# Patient Record
Sex: Female | Born: 1995 | Race: Black or African American | Hispanic: No | Marital: Single | Smoking: Never smoker
Health system: Southern US, Community
[De-identification: ages and names within clinical notes are randomized; demographics above are authoritative.]

---
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
## 2017-08-19 ENCOUNTER — Ambulatory Visit (INDEPENDENT_AMBULATORY_CARE_PROVIDER_SITE_OTHER): Payer: Managed Care, Other (non HMO) | Admitting: *Deleted

## 2017-08-19 DIAGNOSIS — Z111 Encounter for screening for respiratory tuberculosis: Secondary | ICD-10-CM

## 2017-08-21 LAB — TB SKIN TEST
Induration: 0 mm
TB Skin Test: NEGATIVE

## 2017-09-08 ENCOUNTER — Encounter: Payer: Managed Care, Other (non HMO) | Admitting: Family Medicine

## 2017-09-15 ENCOUNTER — Encounter: Payer: Managed Care, Other (non HMO) | Admitting: Family Medicine

## 2017-10-05 ENCOUNTER — Ambulatory Visit (INDEPENDENT_AMBULATORY_CARE_PROVIDER_SITE_OTHER): Payer: Managed Care, Other (non HMO) | Admitting: Family Medicine

## 2017-10-05 ENCOUNTER — Encounter: Payer: Self-pay | Admitting: Family Medicine

## 2017-10-05 VITALS — BP 118/74 | Ht 65.5 in | Wt 198.0 lb

## 2017-10-05 DIAGNOSIS — Z0000 Encounter for general adult medical examination without abnormal findings: Secondary | ICD-10-CM | POA: Diagnosis not present

## 2017-10-05 NOTE — Progress Notes (Signed)
   Subjective:    Patient ID: Lisa Moses, female    DOB: [birth year], 21 y.o.   MRN: [id number]  HPI The patient comes in today for a wellness visit.    A review of their health history was completed.  A review of medications was also completed.  Any needed refills; not taking any meds  Eating habits: not health conscious  Falls/  MVA accidents in past few months: none  Regular exercise: cardio and weight training one hour 5 days a week  Specialist pt sees on regular basis: none  Preventative health issues were discussed.   Additional concerns: none  Declines flu vaccine and HPV. Does not want any vaccines today. Would like to discuss with parents first.     Review of Systems  Constitutional: Negative for activity change, appetite change and fatigue.  HENT: Negative for congestion and rhinorrhea.   Eyes: Negative for discharge.  Respiratory: Negative for cough, chest tightness and wheezing.   Cardiovascular: Negative for chest pain.  Gastrointestinal: Negative for abdominal pain, blood in stool and vomiting.  Endocrine: Negative for polyphagia.  Genitourinary: Negative for difficulty urinating and frequency.  Musculoskeletal: Negative for neck pain.  Skin: Negative for color change.  Allergic/Immunologic: Negative for environmental allergies and food allergies.  Neurological: Negative for weakness and headaches.  Psychiatric/Behavioral: Negative for agitation and behavioral problems.       Objective:   Physical Exam  Constitutional: She is oriented to person, place, and time. She appears well-developed and well-nourished.  HENT:  Head: Normocephalic and atraumatic.  Right Ear: External ear normal.  Left Ear: External ear normal.  Eyes: Right eye exhibits no discharge. Left eye exhibits no discharge.  Neck: Normal range of motion. No tracheal deviation present.  Cardiovascular: Normal rate, regular rhythm, normal heart sounds and intact distal pulses. Exam  reveals no gallop.  No murmur heard. Pulmonary/Chest: Effort normal and breath sounds normal. No stridor. No respiratory distress. She has no wheezes. She has no rales.  Abdominal: Soft. Bowel sounds are normal. She exhibits no distension and no mass. There is no tenderness. There is no rebound and no guarding.  Musculoskeletal: Normal range of motion. She exhibits no edema or tenderness.  Lymphadenopathy:    She has no cervical adenopathy.  Neurological: She is alert and oriented to person, place, and time. She exhibits normal muscle tone.  Skin: Skin is warm and dry.  Psychiatric: She has a normal mood and affect. Her behavior is normal.          Assessment & Plan:  Adult wellness-complete.wellness physical was conducted today. Importance of diet and exercise were discussed in detail. In addition to this a discussion regarding safety was also covered. We also reviewed over immunizations and gave recommendations regarding current immunization needed for age. In addition to this additional areas were also touched on including: Preventative health exams needed: Colonoscopy not indicated  Patient was advised yearly wellness exam  Patient is not sexually active Does not smoke or drink Patient counseled on safety safe driving safe choices Counseled to get HPV and second chickenpox shot through health department or may get HPV vaccine through [place]  Approved for substitute teaching

## 2017-12-21 ENCOUNTER — Ambulatory Visit: Payer: Managed Care, Other (non HMO) | Admitting: Family Medicine

## 2019-02-11 ENCOUNTER — Encounter: Payer: Self-pay | Admitting: Family Medicine

## 2019-02-11 ENCOUNTER — Ambulatory Visit (INDEPENDENT_AMBULATORY_CARE_PROVIDER_SITE_OTHER): Payer: Managed Care, Other (non HMO) | Admitting: Family Medicine

## 2019-02-11 ENCOUNTER — Other Ambulatory Visit: Payer: Self-pay

## 2019-02-11 VITALS — BP 126/84 | Temp 98.2°F | Ht 65.5 in | Wt 172.0 lb

## 2019-02-11 DIAGNOSIS — B349 Viral infection, unspecified: Secondary | ICD-10-CM | POA: Diagnosis not present

## 2019-02-11 NOTE — Progress Notes (Signed)
   Subjective:    Patient ID: Lisa Moses, female    DOB: Apr 26, 1996, 23 y.o.   MRN: [id number]  Sinusitis  This is a new problem. Episode onset: 5 days. Associated symptoms include congestion, headaches and a sore throat. Treatments tried: gargle salt water.  first day or so throat got to hurting and feeloing scratchy  Has ben having heaches   No cough Not severe h a s  enry kevel overall fine  No fever    Results for orders placed or performed in visit on 08/19/17  TB Skin Test  Result Value Ref Range   TB Skin Test Negative    Induration 0 mm   Initial  Review of Systems  HENT: Positive for congestion and sore throat.   Neurological: Positive for headaches.       Objective:   Physical Exam Alert active good hydration HEENT mild stuffiness pharynx normal neck supple lungs clear heart regular       Assessment & Plan:  Impression viral syndrome plan of care discussed warning signs discussed

## 2019-02-14 ENCOUNTER — Encounter: Payer: Self-pay | Admitting: Family Medicine

## 2019-02-21 ENCOUNTER — Telehealth: Payer: Self-pay | Admitting: Family Medicine

## 2019-02-21 ENCOUNTER — Encounter: Payer: Self-pay | Admitting: Family Medicine

## 2019-02-21 NOTE — Telephone Encounter (Signed)
Note ready for pick up. Pt aware.

## 2019-02-21 NOTE — Telephone Encounter (Signed)
Please go ahead and give 

## 2019-02-21 NOTE — Telephone Encounter (Signed)
Please advise. Thank you

## 2019-02-21 NOTE — Telephone Encounter (Signed)
Patient experiencing same symptoms as patients mother runny nose, scratchy throat, headaches, no cough or fever, or vomiting. Patient requesting work note from Tuesday last week 02/15/19 through this week. Advise.

## 2019-02-21 NOTE — Telephone Encounter (Signed)
Please give work note

## 2020-02-02 ENCOUNTER — Other Ambulatory Visit: Payer: Self-pay

## 2020-02-02 ENCOUNTER — Ambulatory Visit: Payer: BC Managed Care – PPO | Attending: Internal Medicine

## 2020-02-02 DIAGNOSIS — Z20822 Contact with and (suspected) exposure to covid-19: Secondary | ICD-10-CM | POA: Diagnosis not present

## 2020-02-03 LAB — NOVEL CORONAVIRUS, NAA: SARS-CoV-2, NAA: NOT DETECTED

## 2020-05-21 ENCOUNTER — Ambulatory Visit (INDEPENDENT_AMBULATORY_CARE_PROVIDER_SITE_OTHER): Payer: BC Managed Care – PPO | Admitting: Family Medicine

## 2020-05-21 ENCOUNTER — Other Ambulatory Visit: Payer: Self-pay

## 2020-05-21 ENCOUNTER — Encounter: Payer: Self-pay | Admitting: Family Medicine

## 2020-05-21 VITALS — BP 130/84 | Temp 98.1°F | Wt 168.8 lb

## 2020-05-21 DIAGNOSIS — R110 Nausea: Secondary | ICD-10-CM | POA: Diagnosis not present

## 2020-05-21 DIAGNOSIS — D509 Iron deficiency anemia, unspecified: Secondary | ICD-10-CM | POA: Diagnosis not present

## 2020-05-21 DIAGNOSIS — R42 Dizziness and giddiness: Secondary | ICD-10-CM

## 2020-05-21 LAB — POCT HEMOGLOBIN: Hemoglobin: 11.8 g/dL (ref 11–14.6)

## 2020-05-21 MED ORDER — ONDANSETRON HCL 8 MG PO TABS: ORAL_TABLET | ORAL | 2 refills | Status: DC

## 2020-05-21 NOTE — Progress Notes (Signed)
   Subjective:    Patient ID: Lisa Moses, female    DOB: December 20, 1995, 24 y.o.   MRN: [id number]  HPI Pt here today for dizziness, nauseous, light headed, vomited x2, headache, abdominal pain when having symptoms, feeling of room spinning. Started about one week ago. Has had these issues back in October and December. Pt states that she gets this symptoms before starting her cycle. Next cycle due 05/28/20.   Pt states she has heavy cycles also. Pt has been taking extra strength Tylenol.  June 11 woke up early vomited 2 times Nausea and off and on dizzy feel Most evenings has the sx No resp illness Stress levels ok as best she knows  Feels like her surroundings are moving a little Room not spinning Light heased sensation a little bit of come and go Some nausea comes and goes No abd pain Has nausea when the lightheaded sensatin occurs Tried tylenol  Cycles off and on heavy Dietary trying to eat ok Drinks a fair amount of water Sleeping good  Moods ok Stress ok Walking some and gets out daily  Laying : 122/84 90 Sitting: 118/82 85 Standing: 124/78 102  Review of Systems  Constitutional: Negative for activity change and appetite change.  HENT: Negative for congestion and rhinorrhea.   Respiratory: Negative for cough and shortness of breath.   Cardiovascular: Negative for chest pain and leg swelling.  Gastrointestinal: Positive for nausea. Negative for abdominal pain and vomiting.  Skin: Negative for color change.  Neurological: Negative for dizziness and weakness.  Psychiatric/Behavioral: Negative for agitation and confusion.       Objective:   Physical Exam Vitals reviewed.  Constitutional:      General: She is not in acute distress. HENT:     Head: Normocephalic and atraumatic.  Eyes:     General:        Right eye: No discharge.        Left eye: No discharge.  Neck:     Trachea: No tracheal deviation.  Cardiovascular:     Rate and Rhythm: Normal rate and  regular rhythm.     Heart sounds: Normal heart sounds. No murmur heard.   Pulmonary:     Effort: Pulmonary effort is normal. No respiratory distress.     Breath sounds: Normal breath sounds.  Lymphadenopathy:     Cervical: No cervical adenopathy.  Skin:    General: Skin is warm and dry.  Neurological:     Mental Status: She is alert.     Coordination: Coordination normal.  Psychiatric:        Behavior: Behavior normal.    Finger-to-nose normal.  Romberg negative.  Neurologic grossly normal.       Assessment & Plan:  Altered alertness Altered sensorium Dizziness Not true vertigo Encourage patient to do a better job with dietary measures throughout the day and better fluid intake Lab work ordered Patient to give [place] update within 2 weeks If persistent symptoms or worse may need referral to neurology I do not feel she needs MRI currently Lab work ordered

## 2020-05-22 ENCOUNTER — Other Ambulatory Visit: Payer: Self-pay | Admitting: *Deleted

## 2020-05-22 DIAGNOSIS — D509 Iron deficiency anemia, unspecified: Secondary | ICD-10-CM

## 2020-05-22 LAB — CBC WITH DIFFERENTIAL/PLATELET
Basophils Absolute: 0.1 10*3/uL (ref 0.0–0.2)
Basos: 1 %
EOS (ABSOLUTE): 0.1 10*3/uL (ref 0.0–0.4)
Eos: 2 %
Hematocrit: 37 % (ref 34.0–46.6)
Hemoglobin: 11.9 g/dL (ref 11.1–15.9)
Immature Grans (Abs): 0 10*3/uL (ref 0.0–0.1)
Immature Granulocytes: 0 %
Lymphocytes Absolute: 2.4 10*3/uL (ref 0.7–3.1)
Lymphs: 41 %
MCH: 22.4 pg — ABNORMAL LOW (ref 26.6–33.0)
MCHC: 32.2 g/dL (ref 31.5–35.7)
MCV: 70 fL — ABNORMAL LOW (ref 79–97)
Monocytes Absolute: 0.5 10*3/uL (ref 0.1–0.9)
Monocytes: 8 %
Neutrophils Absolute: 2.9 10*3/uL (ref 1.4–7.0)
Neutrophils: 48 %
Platelets: 315 10*3/uL (ref 150–450)
RBC: 5.31 x10E6/uL — ABNORMAL HIGH (ref 3.77–5.28)
RDW: 15.5 % — ABNORMAL HIGH (ref 11.7–15.4)
WBC: 5.9 10*3/uL (ref 3.4–10.8)

## 2020-05-22 LAB — COMPREHENSIVE METABOLIC PANEL
ALT: 10 IU/L (ref 0–32)
AST: 12 IU/L (ref 0–40)
Albumin/Globulin Ratio: 1.8 (ref 1.2–2.2)
Albumin: 4.5 g/dL (ref 3.9–5.0)
Alkaline Phosphatase: 41 IU/L — ABNORMAL LOW (ref 48–121)
BUN/Creatinine Ratio: 13 (ref 9–23)
BUN: 10 mg/dL (ref 6–20)
Bilirubin Total: 0.3 mg/dL (ref 0.0–1.2)
CO2: 22 mmol/L (ref 20–29)
Calcium: 9.2 mg/dL (ref 8.7–10.2)
Chloride: 105 mmol/L (ref 96–106)
Creatinine, Ser: 0.79 mg/dL (ref 0.57–1.00)
GFR calc Af Amer: 123 mL/min/{1.73_m2} (ref >59)
GFR calc non Af Amer: 107 mL/min/{1.73_m2} (ref >59)
Globulin, Total: 2.5 g/dL (ref 1.5–4.5)
Glucose: 93 mg/dL (ref 65–99)
Potassium: 4.1 mmol/L (ref 3.5–5.2)
Sodium: 139 mmol/L (ref 134–144)
Total Protein: 7 g/dL (ref 6.0–8.5)

## 2020-05-22 LAB — SPECIMEN STATUS REPORT

## 2020-05-23 LAB — IRON AND TIBC
Iron Saturation: 31 % (ref 15–55)
Iron: 128 ug/dL (ref 27–159)
Total Iron Binding Capacity: 409 ug/dL (ref 250–450)
UIBC: 281 ug/dL (ref 131–425)

## 2020-05-23 LAB — SPECIMEN STATUS REPORT

## 2020-05-31 ENCOUNTER — Encounter: Payer: Self-pay | Admitting: Family Medicine

## 2020-05-31 DIAGNOSIS — R42 Dizziness and giddiness: Secondary | ICD-10-CM

## 2020-05-31 NOTE — Telephone Encounter (Signed)
Nurses I sympathize with what is going on with the patient  She can keep her appointment but I also recommend that we initiate consultation with neurology in [place] because of ongoing symptoms-it could take several weeks to get in with specialist

## 2020-05-31 NOTE — Telephone Encounter (Signed)
Called pt and she states now she is just having nausea. No dizziness since earlier today. States she was just keeping a record of her symptoms because dr scott wanted to get an update and she has an appt coming up next week. Aware dr Nicki Reaper is out of office today.

## 2020-06-01 NOTE — Addendum Note (Signed)
Addended by: Vicente Males on: 06/01/2020 08:12 AM   Modules accepted: Orders

## 2020-06-05 ENCOUNTER — Encounter: Payer: Self-pay | Admitting: Family Medicine

## 2020-06-05 ENCOUNTER — Other Ambulatory Visit: Payer: Self-pay

## 2020-06-05 ENCOUNTER — Ambulatory Visit: Payer: BC Managed Care – PPO | Admitting: Family Medicine

## 2020-06-05 VITALS — BP 112/88 | Temp 97.8°F | Wt 169.6 lb

## 2020-06-05 DIAGNOSIS — R42 Dizziness and giddiness: Secondary | ICD-10-CM

## 2020-06-05 DIAGNOSIS — N926 Irregular menstruation, unspecified: Secondary | ICD-10-CM | POA: Diagnosis not present

## 2020-06-05 MED ORDER — NORETHIN ACE-ETH ESTRAD-FE 1-20 MG-MCG(24) PO TABS: 1.0000 | ORAL_TABLET | Freq: Every day | ORAL | 11 refills | Status: DC

## 2020-06-05 NOTE — Progress Notes (Signed)
   Subjective:    Patient ID: Lisa Moses, female    DOB: [birth year], 24 y.o.   MRN: [id number]  HPI Patient comes in today for follow up on dizziness. Patient states she feels it has been somewhat better. She has had 2 days of feeling nauseous and felt like her surroundings her moving since her last appointment. She did have her menstrual cycle and did not have symptoms which is typical.  Patient would like to discuss birth control.  Where she feels little off balance and will last for anywhere from a few minutes to several hours  Patient relates finds her self feeling unsteady when it occurs but the room does not spin Review of Systems  Constitutional: Negative for activity change, fatigue and fever.  HENT: Negative for congestion and rhinorrhea.   Respiratory: Negative for cough, chest tightness and shortness of breath.   Cardiovascular: Negative for chest pain and leg swelling.  Gastrointestinal: Negative for abdominal pain and nausea.  Skin: Negative for color change.  Neurological: Negative for dizziness and headaches.  Psychiatric/Behavioral: Negative for agitation and behavioral problems.       Objective:   Physical Exam Vitals reviewed.  Constitutional:      General: She is not in acute distress. HENT:     Head: Normocephalic and atraumatic.  Eyes:     General:        Right eye: No discharge.        Left eye: No discharge.  Neck:     Trachea: No tracheal deviation.  Cardiovascular:     Rate and Rhythm: Normal rate and regular rhythm.     Heart sounds: Normal heart sounds. No murmur heard.   Pulmonary:     Effort: Pulmonary effort is normal. No respiratory distress.     Breath sounds: Normal breath sounds.  Lymphadenopathy:     Cervical: No cervical adenopathy.  Skin:    General: Skin is warm and dry.  Neurological:     Mental Status: She is alert.     Coordination: Coordination normal.  Psychiatric:        Behavior: Behavior normal.             Assessment & Plan:  Dizzy spells-patient will be seeing neurology.  I do not feel this is inner ear.  Reassurance given. Patient requesting birth control pills to regulate her cycles she thinks it might actually help her other symptoms as well birth control pills were sent in

## 2020-06-06 ENCOUNTER — Encounter: Payer: Self-pay | Admitting: Family Medicine

## 2020-08-30 ENCOUNTER — Telehealth: Payer: Self-pay | Admitting: *Deleted

## 2020-08-30 NOTE — Telephone Encounter (Signed)
Patient calls with cough, congestion and sore throat for one week- stated she took a Covid test that was negative but now her chest hurts when she coughs. No difficulty breathing except because of stuffy nose. Patient scheduled appt tomorrow for evaluation but advised to go to ER tonight if symptoms worsen- Warning signs discussed.

## 2020-08-31 ENCOUNTER — Other Ambulatory Visit: Payer: Self-pay

## 2020-08-31 ENCOUNTER — Encounter: Payer: Self-pay | Admitting: Family Medicine

## 2020-08-31 ENCOUNTER — Ambulatory Visit (INDEPENDENT_AMBULATORY_CARE_PROVIDER_SITE_OTHER): Payer: BC Managed Care – PPO | Admitting: Family Medicine

## 2020-08-31 VITALS — HR 87 | Temp 100.0°F | Resp 16

## 2020-08-31 DIAGNOSIS — J189 Pneumonia, unspecified organism: Secondary | ICD-10-CM | POA: Diagnosis not present

## 2020-08-31 MED ORDER — AZITHROMYCIN 250 MG PO TABS: ORAL_TABLET | ORAL | 0 refills | Status: DC

## 2020-08-31 NOTE — Progress Notes (Signed)
Patient ID: Lisa Moses, female    DOB: 1996-01-15, 24 y.o.   MRN: [id number]   Chief Complaint  Patient presents with  . Cough   Subjective:    HPI  one week ago started having sore throat, runny nose, chest pain, and coughing. Sore throat has cleared up. Still having chest pain when coughing on right side. No fever. Pt states she took 2 rapid test that were negative and one from [facility] 4 days ago and they called with results yesterday and pt states it was negative.  No n/v/d. Neg- fever. Coughing up sputum and when coughing having some right sided chest pain that radiates to rt back. No sob. otc- tried theraflu and vit c. +covid with mother and sister at home 2 wks ago. Has been 14 days since they were dx.  Her and father didn't test positive for covid.  Medical History Renesmae has no past medical history on file.   Outpatient Encounter Medications as of 08/31/2020  Medication Sig  . Norethindrone Acetate-Ethinyl Estrad-FE (LOESTRIN 24 FE) 1-20 MG-MCG(24) tablet Take 1 tablet by mouth daily.  Marland Kitchen azithromycin (ZITHROMAX) 250 MG tablet Take 2 tab p.o. on day 1, then take 1 tab p.o. for 4 more days.  . [DISCONTINUED] ondansetron (ZOFRAN) 8 MG tablet Take one tablet po 3x/day prn nausea   No facility-administered encounter medications on file as of 08/31/2020.     Review of Systems  Constitutional: Negative for chills and fever.  HENT: Positive for congestion. Negative for ear pain, postnasal drip, rhinorrhea, sinus pressure, sinus pain and sore throat.   Eyes: Negative for pain, discharge and itching.  Respiratory: Positive for cough. Negative for shortness of breath and wheezing.   Cardiovascular: Positive for chest pain.  Gastrointestinal: Negative for constipation, diarrhea, nausea and vomiting.  Skin: Negative for rash.  Neurological: Negative for dizziness and headaches.     Vitals Pulse 87   Temp 100 F (37.8 C) (Temporal)   Resp 16   SpO2 98%   Objective:    Physical Exam Vitals and nursing note reviewed.  Constitutional:      General: She is not in acute distress.    Appearance: Normal appearance. She is not ill-appearing.  HENT:     Head: Normocephalic and atraumatic.     Right Ear: Tympanic membrane, ear canal and external ear normal.     Left Ear: Tympanic membrane, ear canal and external ear normal.     Nose: Nose normal. No congestion or rhinorrhea.     Mouth/Throat:     Mouth: Mucous membranes are moist.     Pharynx: Oropharynx is clear. No oropharyngeal exudate or posterior oropharyngeal erythema.  Eyes:     Extraocular Movements: Extraocular movements intact.     Conjunctiva/sclera: Conjunctivae normal.     Pupils: Pupils are equal, round, and reactive to light.  Cardiovascular:     Rate and Rhythm: Normal rate and regular rhythm.     Pulses: Normal pulses.     Heart sounds: Normal heart sounds. No murmur heard.   Pulmonary:     Effort: Pulmonary effort is normal. No respiratory distress.     Breath sounds: Normal breath sounds. No wheezing, rhonchi or rales.  Musculoskeletal:        General: Normal range of motion.     Right lower leg: No edema.     Left lower leg: No edema.  Skin:    General: Skin is warm and dry.  Findings: No lesion or rash.  Neurological:     General: No focal deficit present.     Mental Status: She is alert and oriented to person, place, and time.  Psychiatric:        Mood and Affect: Mood normal.        Behavior: Behavior normal.      Assessment and Plan   1. Pneumonia of right lung due to infectious organism, unspecified part of lung - azithromycin (ZITHROMAX) 250 MG tablet; Take 2 tab p.o. on day 1, then take 1 tab p.o. for 4 more days.  Dispense: 6 tablet; Refill: 0    Negative covid testing x3.  Will treat for possible pneumonia.   Gave course of azithromycin.  mucinex or delsym and increase fluids. If not improving by mon or tues then to call back, may need imaging.  F/u  prn.

## 2020-09-21 ENCOUNTER — Ambulatory Visit: Payer: BC Managed Care – PPO | Admitting: Family Medicine

## 2020-09-21 ENCOUNTER — Other Ambulatory Visit: Payer: Self-pay

## 2020-09-21 ENCOUNTER — Encounter: Payer: Self-pay | Admitting: Family Medicine

## 2020-09-21 VITALS — BP 118/82 | HR 59 | Temp 97.4°F | Wt 175.4 lb

## 2020-09-21 DIAGNOSIS — S46911A Strain of unspecified muscle, fascia and tendon at shoulder and upper arm level, right arm, initial encounter: Secondary | ICD-10-CM | POA: Insufficient documentation

## 2020-09-21 DIAGNOSIS — M79621 Pain in right upper arm: Secondary | ICD-10-CM

## 2020-09-21 MED ORDER — MELOXICAM 15 MG PO TABS: 15.0000 mg | ORAL_TABLET | Freq: Every day | ORAL | 0 refills | Status: DC

## 2020-09-21 NOTE — Patient Instructions (Signed)
Continue conservative treatment. Rest, ice (20 minutes every 1-2 hours), start anti-inflammatory with food. Use sleeve for comfort.     Muscle Strain A muscle strain is an injury that happens when a muscle is stretched longer than normal. This can happen during a fall, sports, or lifting. This can tear some muscle fibers. Usually, recovery from muscle strain takes 1-2 weeks. Complete healing normally takes 5-6 weeks. This condition is first treated with PRICE therapy. This involves:  Protecting your muscle from being injured again.  Resting your injured muscle.  Icing your injured muscle.  Applying pressure (compression) to your injured muscle. This may be done with a splint or elastic bandage.  Raising (elevating) your injured muscle. Your doctor may also recommend medicine for pain. Follow these instructions at home: If you have a splint:  Wear the splint as told by your doctor. Take it off only as told by your doctor.  Loosen the splint if your fingers or toes tingle, get numb, or turn cold and blue.  Keep the splint clean.  If the splint is not waterproof: ? Do not let it get wet. ? Cover it with a watertight covering when you take a bath or a shower. Managing pain, stiffness, and swelling   If directed, put ice on your injured area. ? If you have a removable splint, take it off as told by your doctor. ? Put ice in a plastic bag. ? Place a towel between your skin and the bag. ? Leave the ice on for 20 minutes, 2-3 times a day.  Move your fingers or toes often. This helps to avoid stiffness and lessen swelling.  Raise your injured area above the level of your heart while you are sitting or lying down.  Wear an elastic bandage as told by your doctor. Make sure it is not too tight. General instructions  Take over-the-counter and prescription medicines only as told by your doctor.  Limit your activity. Rest your injured muscle as told by your doctor. Your doctor may  say that gentle movements are okay.  If physical therapy was prescribed, do exercises as told by your doctor.  Do not put pressure on any part of the splint until it is fully hardened. This may take many hours.  Do not use any products that contain nicotine or tobacco, such as cigarettes and e-cigarettes. These can delay bone healing. If you need help quitting, ask your doctor.  Warm up before you exercise. This helps to prevent more muscle strains.  Ask your doctor when it is safe to drive if you have a splint.  Keep all follow-up visits as told by your doctor. This is important. Contact a doctor if:  You have more pain or swelling in your injured area. Get help right away if:  You have any of these problems in your injured area: ? You have numbness. ? You have tingling. ? You lose a lot of strength. Summary  A muscle strain is an injury that happens when a muscle is stretched longer than normal.  This condition is first treated with PRICE therapy. This includes protecting, resting, icing, adding pressure, and raising your injury.  Limit your activity. Rest your injured muscle as told by your doctor. Your doctor may say that gentle movements are okay.  Warm up before you exercise. This helps to prevent more muscle strains. This information is not intended to replace advice given to you by your health care provider. Make sure you discuss any questions you  have with your health care provider. Document Revised: 01/13/2019 Document Reviewed: 12/24/2016 Elsevier Patient Education  Lisa Moses.

## 2020-09-21 NOTE — Progress Notes (Addendum)
Patient ID: Lisa Moses, female    DOB: 01/07/1996, 24 y.o.   MRN: [id number]   Chief Complaint  Patient presents with  . Arm Pain    Patient comes in today with right upper arm pain x 1 week. Patient was lifting weights and felt severe pain. Since then has been minor but feels the pain when bending or lifting.    Subjective:  CC: right arm pain  Patient presents today with right upper arm pain.  On October 1 she was diagnosed by Dr. Lovena Le with pneumonia, and at that time she had pain in the right upper chest area.  That pain and discomfort has now improved, but she was concerned with the right upper arm pain afraid that it was the same thing.  She does tell me that on Monday she was at the gym, was using the squat machine and when she came back up she felt the pain in her right tricep.  Since Wednesday the pain has improved with her conservative measures, she has been using a compression sleeve, used ice 1 time, and took a muscle relaxer one time on Tuesday.  Pertinent negatives include no obvious deformity of the right triceps muscle, no warmth to touch.    Medical History Taniqua has no past medical history on file.   Outpatient Encounter Medications as of 09/21/2020  Medication Sig  . meloxicam (MOBIC) 15 MG tablet Take 1 tablet (15 mg total) by mouth daily.  . Norethindrone Acetate-Ethinyl Estrad-FE (LOESTRIN 24 FE) 1-20 MG-MCG(24) tablet Take 1 tablet by mouth daily.  . [DISCONTINUED] azithromycin (ZITHROMAX) 250 MG tablet Take 2 tab p.o. on day 1, then take 1 tab p.o. for 4 more days.   No facility-administered encounter medications on file as of 09/21/2020.     Review of Systems  Constitutional: Negative.   HENT: Negative.   Respiratory: Negative.   Cardiovascular: Negative.   Musculoskeletal:       Right tricep injury at the gym  Skin: Negative.   Neurological: Negative.   Hematological: Negative.      Vitals BP 118/82   Pulse (!) 59   Temp (!) 97.4 F  (36.3 C)   Wt 175 lb 6.4 oz (79.6 kg)   SpO2 100%   BMI 28.74 kg/m   Objective:   Physical Exam Vitals and nursing note reviewed.  Constitutional:      Appearance: Normal appearance.  Cardiovascular:     Rate and Rhythm: Normal rate and regular rhythm.     Pulses: Normal pulses.     Heart sounds: Normal heart sounds.  Pulmonary:     Effort: Pulmonary effort is normal.     Breath sounds: Normal breath sounds.  Musculoskeletal:        General: Tenderness present. No swelling, deformity or signs of injury. Normal range of motion.     Comments: Right tricep.  Skin:    General: Skin is warm and dry.  Neurological:     Mental Status: She is alert.      Assessment and Plan   1. Muscle strain of right upper arm, initial encounter - meloxicam (MOBIC) 15 MG tablet; Take 1 tablet (15 mg total) by mouth daily.  Dispense: 30 tablet; Refill: 0   Very likely that this is a muscle strain of the upper arm.  Your conservative treatment has helped greatly since Monday.  Continue using ice for 20 minutes every 1-2 hours, rest your arm as much as possible, start the meloxicam  once daily with food, and use the compression sleeve as  needed for Comfort.  Instructions given on anti-inflammatory medication precautions.  Agrees with plan of care discussed today. Understands warning signs to seek further care: Increasing pain, deformity, or any significant changes in health status Understands to follow-up if symptoms do not improve.  Should avoid heavy lifting at the gym until all symptoms have resolved.  Pecolia Ades, FNP-C

## 2020-10-05 ENCOUNTER — Ambulatory Visit: Payer: BC Managed Care – PPO | Admitting: Nurse Practitioner

## 2020-10-09 ENCOUNTER — Other Ambulatory Visit: Payer: Self-pay

## 2020-10-09 ENCOUNTER — Ambulatory Visit: Payer: BC Managed Care – PPO | Admitting: Family Medicine

## 2020-10-09 ENCOUNTER — Encounter: Payer: Self-pay | Admitting: Family Medicine

## 2020-10-09 VITALS — BP 124/84 | HR 103 | Temp 97.8°F | Wt 174.8 lb

## 2020-10-09 DIAGNOSIS — Z3009 Encounter for other general counseling and advice on contraception: Secondary | ICD-10-CM | POA: Diagnosis not present

## 2020-10-09 LAB — POCT URINE PREGNANCY: Preg Test, Ur: NEGATIVE

## 2020-10-09 MED ORDER — LEVONORGESTREL-ETHINYL ESTRAD 0.1-20 MG-MCG PO CHEW: 1.0000 | CHEWABLE_TABLET | Freq: Every day | ORAL | 2 refills | Status: DC

## 2020-10-09 NOTE — Patient Instructions (Signed)
Continue exercising as you already do.   DASH Eating Plan DASH stands for "Dietary Approaches to Stop Hypertension." The DASH eating plan is a healthy eating plan that has been shown to reduce high blood pressure (hypertension). It may also reduce your risk for type 2 diabetes, heart disease, and stroke. The DASH eating plan may also help with weight loss. What are tips for following this plan?  General guidelines  Avoid eating more than 2,300 mg (milligrams) of salt (sodium) a day. If you have hypertension, you may need to reduce your sodium intake to 1,500 mg a day.  Limit alcohol intake to no more than 1 drink a day for nonpregnant women and 2 drinks a day for men. One drink equals 12 oz of beer, 5 oz of wine, or 1 oz of hard liquor.  Work with your health care provider to maintain a healthy body weight or to lose weight. Ask what an ideal weight is for you.  Get at least 30 minutes of exercise that causes your heart to beat faster (aerobic exercise) most days of the week. Activities may include walking, swimming, or biking.  Work with your health care provider or diet and nutrition specialist (dietitian) to adjust your eating plan to your individual calorie needs. Reading food labels   Check food labels for the amount of sodium per serving. Choose foods with less than 5 percent of the Daily Value of sodium. Generally, foods with less than 300 mg of sodium per serving fit into this eating plan.  To find whole grains, look for the word "whole" as the first word in the ingredient list. Shopping  Buy products labeled as "low-sodium" or "no salt added."  Buy fresh foods. Avoid canned foods and premade or frozen meals. Cooking  Avoid adding salt when cooking. Use salt-free seasonings or herbs instead of table salt or sea salt. Check with your health care provider or pharmacist before using salt substitutes.  Do not fry foods. Cook foods using healthy methods such as baking, boiling,  grilling, and broiling instead.  Cook with heart-healthy oils, such as olive, canola, soybean, or sunflower oil. Meal planning  Eat a balanced diet that includes: ? 5 or more servings of fruits and vegetables each day. At each meal, try to fill half of your plate with fruits and vegetables. ? Up to 6-8 servings of whole grains each day. ? Less than 6 oz of lean meat, poultry, or fish each day. A 3-oz serving of meat is about the same size as a deck of cards. One egg equals 1 oz. ? 2 servings of low-fat dairy each day. ? A serving of nuts, seeds, or beans 5 times each week. ? Heart-healthy fats. Healthy fats called Omega-3 fatty acids are found in foods such as flaxseeds and coldwater fish, like sardines, salmon, and mackerel.  Limit how much you eat of the following: ? Canned or prepackaged foods. ? Food that is high in trans fat, such as fried foods. ? Food that is high in saturated fat, such as fatty meat. ? Sweets, desserts, sugary drinks, and other foods with added sugar. ? Full-fat dairy products.  Do not salt foods before eating.  Try to eat at least 2 vegetarian meals each week.  Eat more home-cooked food and less restaurant, buffet, and fast food.  When eating at a restaurant, ask that your food be prepared with less salt or no salt, if possible. What foods are recommended? The items listed may not be  a complete list. Talk with your dietitian about what dietary choices are best for you. Grains Whole-grain or whole-wheat bread. Whole-grain or whole-wheat pasta. Brown rice. [place]. Bulgur. Whole-grain and low-sodium cereals. Pita bread. Low-fat, low-sodium crackers. Whole-wheat flour tortillas. Vegetables Fresh or frozen vegetables (raw, steamed, roasted, or grilled). Low-sodium or reduced-sodium tomato and vegetable juice. Low-sodium or reduced-sodium tomato sauce and tomato paste. Low-sodium or reduced-sodium canned vegetables. Fruits All fresh, dried, or frozen  fruit. Canned fruit in natural juice (without added sugar). Meat and other protein foods Skinless chicken or [place]. Ground chicken or [place]. Pork with fat trimmed off. Fish and seafood. Egg whites. Dried beans, peas, or lentils. Unsalted nuts, nut butters, and seeds. Unsalted canned beans. Lean cuts of beef with fat trimmed off. Low-sodium, lean deli meat. Dairy Low-fat (1%) or fat-free (skim) milk. Fat-free, low-fat, or reduced-fat cheeses. Nonfat, low-sodium ricotta or cottage cheese. Low-fat or nonfat yogurt. Low-fat, low-sodium cheese. Fats and oils Soft margarine without trans fats. Vegetable oil. Low-fat, reduced-fat, or light mayonnaise and salad dressings (reduced-sodium). Canola, safflower, olive, soybean, and sunflower oils. Avocado. Seasoning and other foods Herbs. Spices. Seasoning mixes without salt. Unsalted popcorn and pretzels. Fat-free sweets. What foods are not recommended? The items listed may not be a complete list. Talk with your dietitian about what dietary choices are best for you. Grains Baked goods made with fat, such as croissants, muffins, or some breads. Dry pasta or rice meal packs. Vegetables Creamed or fried vegetables. Vegetables in a cheese sauce. Regular canned vegetables (not low-sodium or reduced-sodium). Regular canned tomato sauce and paste (not low-sodium or reduced-sodium). Regular tomato and vegetable juice (not low-sodium or reduced-sodium). Angie Fava. Olives. Fruits Canned fruit in a light or heavy syrup. Fried fruit. Fruit in cream or butter sauce. Meat and other protein foods Fatty cuts of meat. Ribs. Fried meat. Berniece Salines. Sausage. Bologna and other processed lunch meats. Salami. Fatback. Hotdogs. Bratwurst. Salted nuts and seeds. Canned beans with added salt. Canned or smoked fish. Whole eggs or egg yolks. Chicken or [place] with skin. Dairy Whole or 2% milk, cream, and half-and-half. Whole or full-fat cream cheese. Whole-fat or sweetened yogurt. Full-fat  cheese. Nondairy creamers. Whipped toppings. Processed cheese and cheese spreads. Fats and oils Butter. Stick margarine. Lard. Shortening. Ghee. Bacon fat. Tropical oils, such as coconut, palm kernel, or palm oil. Seasoning and other foods Salted popcorn and pretzels. Onion salt, garlic salt, seasoned salt, table salt, and sea salt. Worcestershire sauce. Tartar sauce. Barbecue sauce. Teriyaki sauce. Soy sauce, including reduced-sodium. Steak sauce. Canned and packaged gravies. Fish sauce. Oyster sauce. Cocktail sauce. Horseradish that you find on the shelf. Ketchup. Mustard. Meat flavorings and tenderizers. Bouillon cubes. Hot sauce and Tabasco sauce. Premade or packaged marinades. Premade or packaged taco seasonings. Relishes. Regular salad dressings. Where to find more information:  National Heart, Lung, and [place]: https://wilson-eaton.com/  American Heart Association: www.heart.org Summary  The DASH eating plan is a healthy eating plan that has been shown to reduce high blood pressure (hypertension). It may also reduce your risk for type 2 diabetes, heart disease, and stroke.  With the DASH eating plan, you should limit salt (sodium) intake to 2,300 mg a day. If you have hypertension, you may need to reduce your sodium intake to 1,500 mg a day.  When on the DASH eating plan, aim to eat more fresh fruits and vegetables, whole grains, lean proteins, low-fat dairy, and heart-healthy fats.  Work with your health care provider or diet and nutrition specialist (  dietitian) to adjust your eating plan to your individual calorie needs. This information is not intended to replace advice given to you by your health care provider. Make sure you discuss any questions you have with your health care provider. Document Revised: 10/30/2017 Document Reviewed: 11/10/2016 Elsevier Patient Education  2020 [employer].

## 2020-10-09 NOTE — Progress Notes (Signed)
   Patient ID: Lisa Moses, female    DOB: Apr 18, 1996, 24 y.o.   MRN: [id number]   Chief Complaint  Patient presents with  . Contraception    Patient wants to discuss changing birth controls. States Loestrin 24 is making her too emotional. Stopped medication yesterday.    Subjective:  CC: emotional with current birth control pill  Presents today for follow-up on her oral contraceptive pills.  Her mom noted that she is more sad than usual since she started these pills.  Coincidentally, it has affected her twin sister in the a similar way.  She wishes to change OCP today.  She is not sexually active.    Medical History Emori has no past medical history on file.   Outpatient Encounter Medications as of 10/09/2020  Medication Sig  . Levonorgestrel-Ethinyl Estrad 0.1-20 MG-MCG CHEW Chew 1 tablet by mouth daily.  . meloxicam (MOBIC) 15 MG tablet Take 1 tablet (15 mg total) by mouth daily.  . [DISCONTINUED] Norethindrone Acetate-Ethinyl Estrad-FE (LOESTRIN 24 FE) 1-20 MG-MCG(24) tablet Take 1 tablet by mouth daily.   No facility-administered encounter medications on file as of 10/09/2020.     Review of Systems  Constitutional: Negative for chills and fever.  Respiratory: Negative for shortness of breath.   Cardiovascular: Negative for chest pain.  Gastrointestinal: Negative for abdominal pain.  Musculoskeletal:       No leg pain     Vitals BP 124/84   Pulse (!) 103   Temp 97.8 F (36.6 C)   Wt 174 lb 12.8 oz (79.3 kg)   SpO2 100%   BMI 28.65 kg/m   Objective:   Physical Exam Vitals and nursing note reviewed.  Constitutional:      Appearance: Normal appearance.  Cardiovascular:     Rate and Rhythm: Normal rate and regular rhythm.     Heart sounds: Normal heart sounds.  Pulmonary:     Effort: Pulmonary effort is normal.     Breath sounds: Normal breath sounds.  Neurological:     Mental Status: She is alert and oriented to person, place, and time.  Psychiatric:         Mood and Affect: Mood normal.        Behavior: Behavior normal.        Thought Content: Thought content normal.        Judgment: Judgment normal.     Screening for initiating COC's  . Smoking status: nonsmoker  . HTN: none  . Diabetes: no  . Migraine with aura: no  . History of breast cancer: no  . VTE status/history: none  . Postpartum status: no  Results for orders placed or performed in visit on 10/09/20  POCT urine pregnancy  Result Value Ref Range   Preg Test, Ur Negative Negative     Assessment and Plan   1. Birth control counseling - POCT urine pregnancy - Levonorgestrel-Ethinyl Estrad 0.1-20 MG-MCG CHEW; Chew 1 tablet by mouth daily.  Dispense: 28 tablet; Refill: 2   Low risk identified for complications of estrogen therapy.  Precautions discussed.  She will start new OCP immediately.  Urine pregnancy negative.  Agrees with plan of care discussed today. Understands warning signs to seek further care: Chest pain, shortness of breath, new leg pain. Understands to follow-up in 3 months, sooner if needed.

## 2020-10-16 ENCOUNTER — Other Ambulatory Visit: Payer: Self-pay | Admitting: Family Medicine

## 2020-10-16 DIAGNOSIS — S46911A Strain of unspecified muscle, fascia and tendon at shoulder and upper arm level, right arm, initial encounter: Secondary | ICD-10-CM

## 2020-10-29 ENCOUNTER — Ambulatory Visit: Payer: BC Managed Care – PPO | Admitting: Family Medicine

## 2020-10-29 ENCOUNTER — Encounter: Payer: Self-pay | Admitting: Family Medicine

## 2020-10-29 ENCOUNTER — Other Ambulatory Visit: Payer: Self-pay

## 2020-10-29 VITALS — BP 116/72 | Temp 98.2°F | Ht 66.0 in | Wt 180.0 lb

## 2020-10-29 DIAGNOSIS — E040 Nontoxic diffuse goiter: Secondary | ICD-10-CM

## 2020-10-29 DIAGNOSIS — E049 Nontoxic goiter, unspecified: Secondary | ICD-10-CM | POA: Diagnosis not present

## 2020-10-29 NOTE — Progress Notes (Signed)
   Subjective:    Patient ID: Lisa Moses, female    DOB: 20-Jul-1996, 24 y.o.   MRN: [id number]  HPI  Patient arrives with knot on both sides of neck for 2-3 weeks. She comes in today with a concern that she has a swollen area on both sides of her neck that has been apparent over the past 2 to 3 weeks denies any tenderness pain discomfort fevers chills sweats or sickness. Review of Systems Please see above there is been no changes in weight energy level sweats chills fever    Objective:   Physical Exam Lungs clear respiratory rate normal heart regular no murmurs pulse normal. Goiter is noted not bilateral.  No nodules.      Assessment & Plan:  Goiter Thyroid testing indicated May need possibly ultrasound or uptake scan Possibly will need referral to endocrinology Await the results of these tests first

## 2020-10-30 LAB — T3: T3, Total: 152 ng/dL (ref 71–180)

## 2020-10-30 LAB — TSH: TSH: 1.88 u[IU]/mL (ref 0.450–4.500)

## 2020-10-30 LAB — THYROID PEROXIDASE ANTIBODY: Thyroperoxidase Ab SerPl-aCnc: <8 IU/mL (ref 0–34)

## 2020-10-30 LAB — T4, FREE: Free T4: 1.08 ng/dL (ref 0.82–1.77)

## 2020-10-31 ENCOUNTER — Other Ambulatory Visit: Payer: Self-pay | Admitting: *Deleted

## 2020-10-31 DIAGNOSIS — E049 Nontoxic goiter, unspecified: Secondary | ICD-10-CM

## 2020-11-07 ENCOUNTER — Telehealth: Payer: Self-pay | Admitting: Family Medicine

## 2020-11-15 ENCOUNTER — Ambulatory Visit (HOSPITAL_COMMUNITY): Payer: BC Managed Care – PPO

## 2020-11-15 ENCOUNTER — Encounter: Payer: Self-pay | Admitting: *Deleted

## 2020-11-19 ENCOUNTER — Other Ambulatory Visit: Payer: Self-pay

## 2020-11-19 ENCOUNTER — Ambulatory Visit (HOSPITAL_COMMUNITY)
Admission: RE | Admit: 2020-11-19 | Discharge: 2020-11-19 | Disposition: A | Discharge status: Home / Self Care | Payer: BC Managed Care – PPO | Source: Ambulatory Visit | Attending: Family Medicine | Admitting: Family Medicine

## 2020-11-19 DIAGNOSIS — E042 Nontoxic multinodular goiter: Secondary | ICD-10-CM | POA: Diagnosis not present

## 2020-11-19 DIAGNOSIS — E049 Nontoxic goiter, unspecified: Secondary | ICD-10-CM | POA: Diagnosis not present

## 2020-11-26 ENCOUNTER — Other Ambulatory Visit: Payer: Self-pay | Admitting: Family Medicine

## 2020-11-26 DIAGNOSIS — E041 Nontoxic single thyroid nodule: Secondary | ICD-10-CM

## 2020-12-27 ENCOUNTER — Other Ambulatory Visit (HOSPITAL_COMMUNITY): Payer: Self-pay | Admitting: Otolaryngology

## 2020-12-27 DIAGNOSIS — E042 Nontoxic multinodular goiter: Secondary | ICD-10-CM

## 2020-12-27 DIAGNOSIS — D440 Neoplasm of uncertain behavior of thyroid gland: Secondary | ICD-10-CM | POA: Diagnosis not present

## 2020-12-28 ENCOUNTER — Other Ambulatory Visit: Payer: Self-pay | Admitting: Family Medicine

## 2020-12-28 DIAGNOSIS — Z3009 Encounter for other general counseling and advice on contraception: Secondary | ICD-10-CM

## 2021-01-01 ENCOUNTER — Other Ambulatory Visit (HOSPITAL_COMMUNITY): Payer: Self-pay | Admitting: Otolaryngology

## 2021-01-01 DIAGNOSIS — E042 Nontoxic multinodular goiter: Secondary | ICD-10-CM

## 2021-01-09 ENCOUNTER — Ambulatory Visit: Payer: BC Managed Care – PPO | Admitting: Family Medicine

## 2021-01-10 ENCOUNTER — Ambulatory Visit (HOSPITAL_COMMUNITY)
Admission: RE | Admit: 2021-01-10 | Discharge: 2021-01-10 | Disposition: A | Discharge status: Home / Self Care | Payer: BC Managed Care – PPO | Source: Ambulatory Visit | Attending: Otolaryngology | Admitting: Otolaryngology

## 2021-01-10 ENCOUNTER — Encounter (HOSPITAL_COMMUNITY): Payer: Self-pay

## 2021-01-10 ENCOUNTER — Other Ambulatory Visit: Payer: Self-pay

## 2021-01-10 ENCOUNTER — Encounter: Payer: Self-pay | Admitting: Family Medicine

## 2021-01-10 DIAGNOSIS — E042 Nontoxic multinodular goiter: Secondary | ICD-10-CM

## 2021-01-10 DIAGNOSIS — D34 Benign neoplasm of thyroid gland: Secondary | ICD-10-CM | POA: Diagnosis not present

## 2021-01-10 MED ORDER — LIDOCAINE HCL (PF) 2 % IJ SOLN
INTRAMUSCULAR | Status: AC
  Administered 2021-01-10: 10 mL
  Filled 2021-01-10: qty 20

## 2021-01-10 NOTE — Discharge Instructions (Signed)
Thyroid Needle Biopsy, Care After This sheet gives you information about how to care for yourself after your procedure. Your health care provider may also give you more specific instructions. If you have problems or questions, contact your health care provider. What can I expect after the procedure? After the procedure, it is common to have:  Soreness and tenderness that lasts for a few days.  Bruising where the needle was inserted (puncture site). Follow these instructions at home:  Take over-the-counter and prescription medicines only as told by your health care provider.  To help ease discomfort, keep your head raised (elevated) when you are lying down. When you move from lying down to sitting up, use both hands to support the back of your head and neck.  Check your puncture site every day for signs of infection. Check for: ? Redness, swelling, or pain. ? Fluid or blood. ? Warmth. ? Pus or a bad smell.  Return to your normal activities as told by your health care provider. Ask your health care provider what activities are safe for you.  Keep all follow-up visits as told by your health care provider. This is important.   Contact a health care provider if:  You have redness, swelling, or pain around your puncture site.  You have fluid or blood coming from your puncture site.  Your puncture site feels warm to the touch.  You have pus or a bad smell coming from your puncture site.  You have a fever. Get help right away if:  You have severe bleeding from the puncture site.  You have difficulty swallowing.  You have swollen glands (lymph nodes) in your neck. Summary  It is common to have some bruising and soreness where the needle was inserted in your lower front neck area (puncture site).  Check your puncture site every day for signs of infection, such as redness, swelling, or pain.  Get help right away if you have severe bleeding from your puncture site. This  information is not intended to replace advice given to you by your health care provider. Make sure you discuss any questions you have with your health care provider. Document Revised: 07/26/2020 Document Reviewed: 07/26/2020 Elsevier Patient Education  2021 Elsevier Inc.  

## 2021-01-10 NOTE — Telephone Encounter (Signed)
Front Please provide her for a work note for those 2 days thank you very much Please send via MyChart  Thanks-Dr. Nicki Reaper

## 2021-01-10 NOTE — Sedation Documentation (Signed)
PT tolerated thyroid biopsy of right and left sided procedure well today. Labs obtained and sent for pathology. PT ambulatory at discharge with no acute distress noted and verbalized understanding of discharge instructions.

## 2021-01-11 ENCOUNTER — Encounter: Payer: Self-pay | Admitting: Family Medicine

## 2021-01-14 LAB — CYTOLOGY - NON PAP

## 2021-03-30 ENCOUNTER — Other Ambulatory Visit: Payer: Self-pay | Admitting: Family Medicine

## 2021-03-30 DIAGNOSIS — Z3009 Encounter for other general counseling and advice on contraception: Secondary | ICD-10-CM

## 2021-06-26 ENCOUNTER — Other Ambulatory Visit: Payer: Self-pay | Admitting: Family Medicine

## 2021-06-26 DIAGNOSIS — Z3009 Encounter for other general counseling and advice on contraception: Secondary | ICD-10-CM

## 2021-06-28 NOTE — Telephone Encounter (Signed)
Please contact patient to have her set up appt. Thank you 

## 2021-07-01 NOTE — Telephone Encounter (Signed)
Sent mychart message

## 2021-08-13 NOTE — Telephone Encounter (Signed)
Sent another my chart message to schedule appointment also call 8/25 no response

## 2021-10-22 IMAGING — US US THYROID
1 series · 13 of 25 positions shown · non-contrast
Comparison: None.

CLINICAL DATA: Pain and swelling x3 weeks

EXAM:
THYROID ULTRASOUND
TECHNIQUE: Ultrasound examination of the thyroid gland and adjacent soft
tissues was performed.

[Series 1: us thyroid · 13 of 71 slices shown]
[im 1/71]
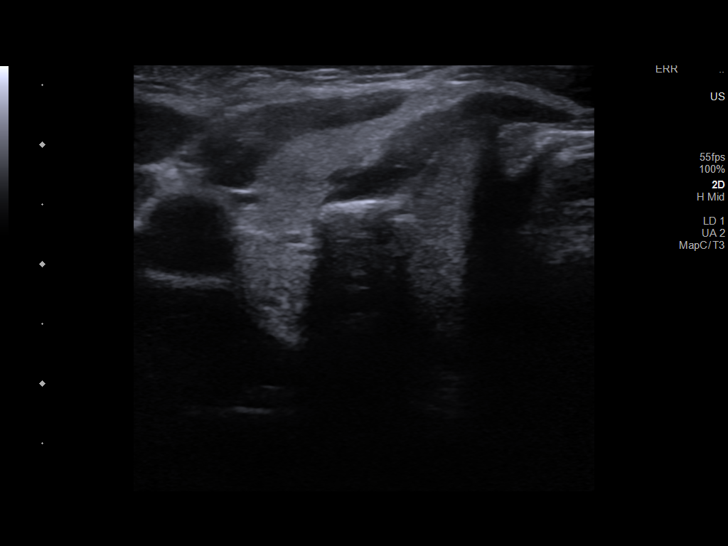
[im 6/71]
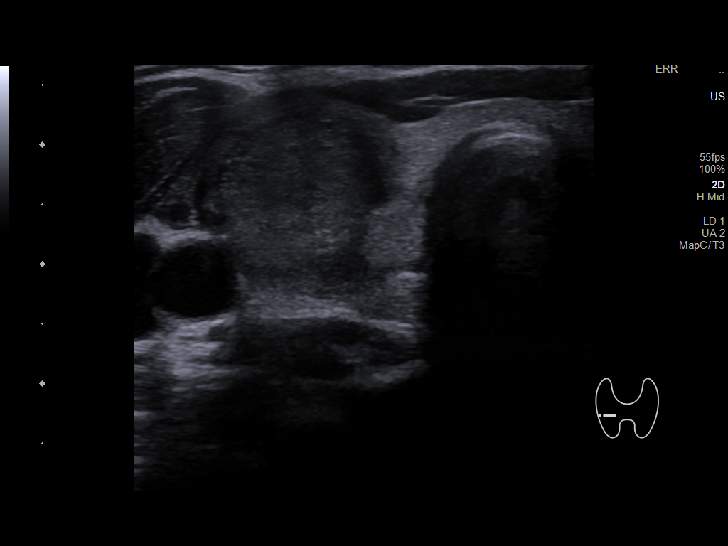
[im 12/71]
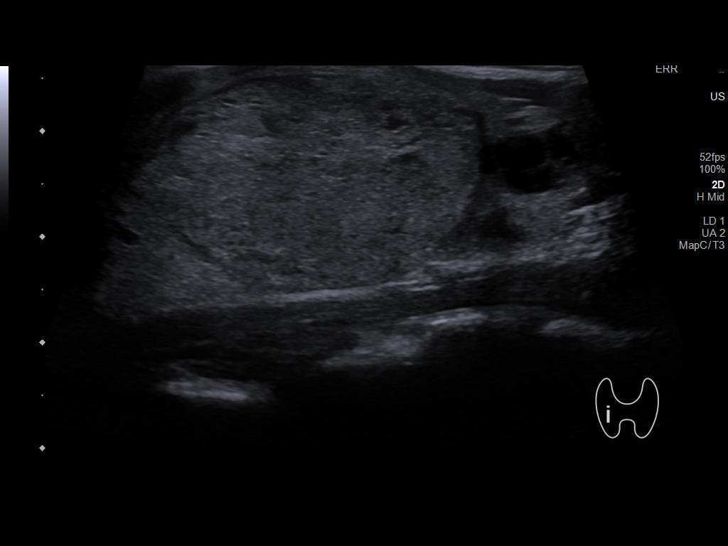
[im 18/71]
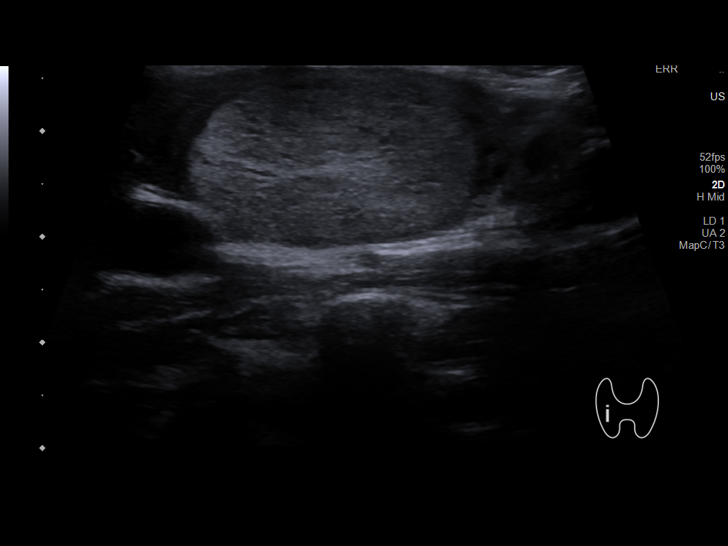
[im 24/71]
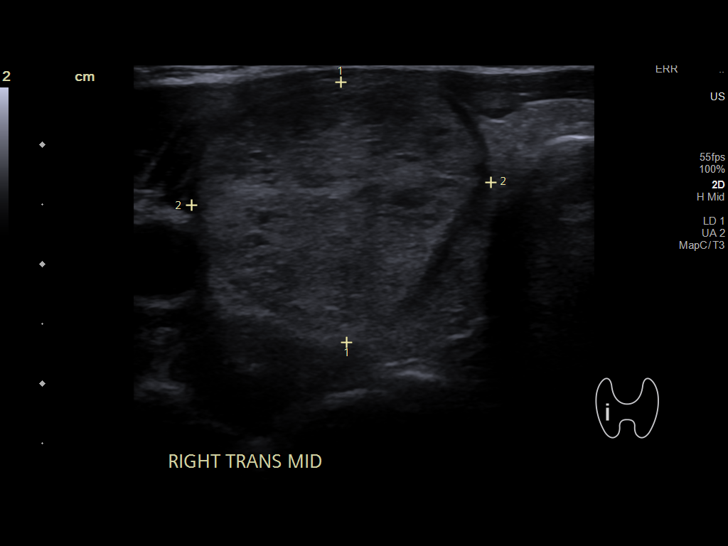
[im 30/71]
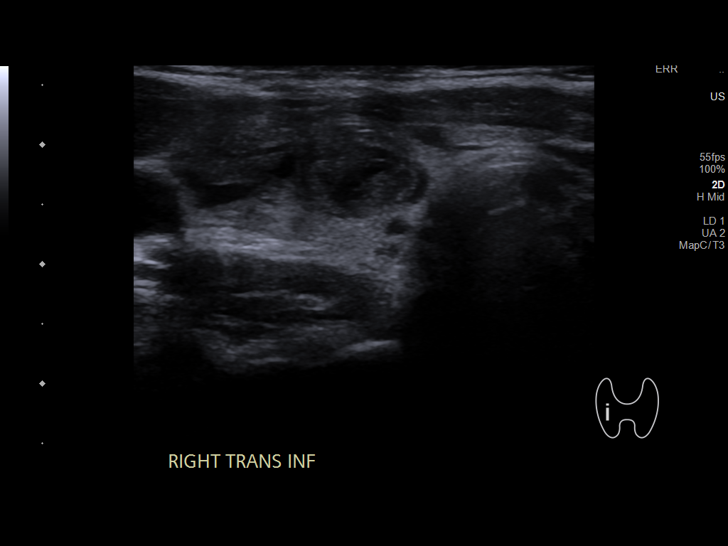
[im 36/71]
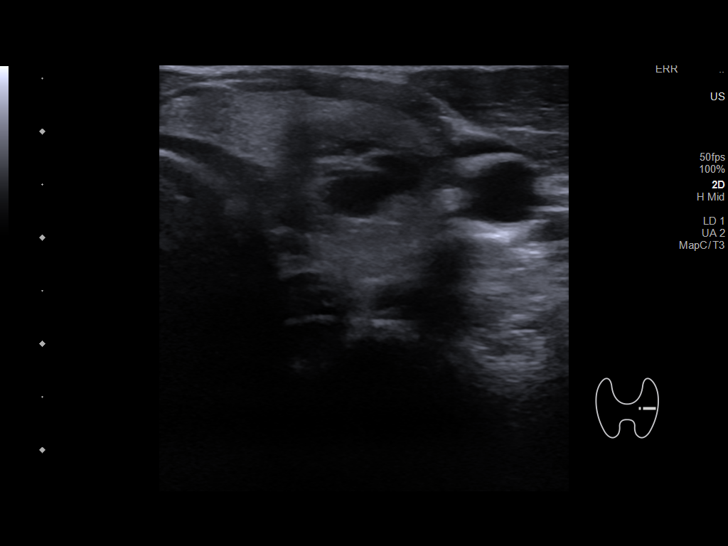
[im 41/71]
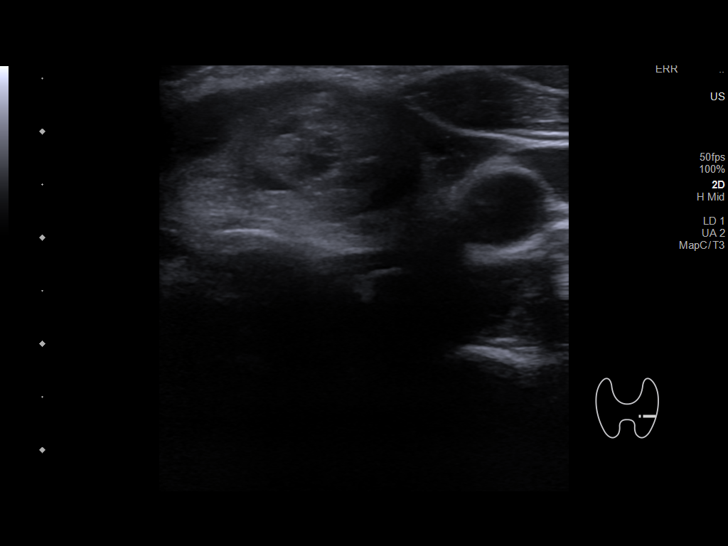
[im 47/71]
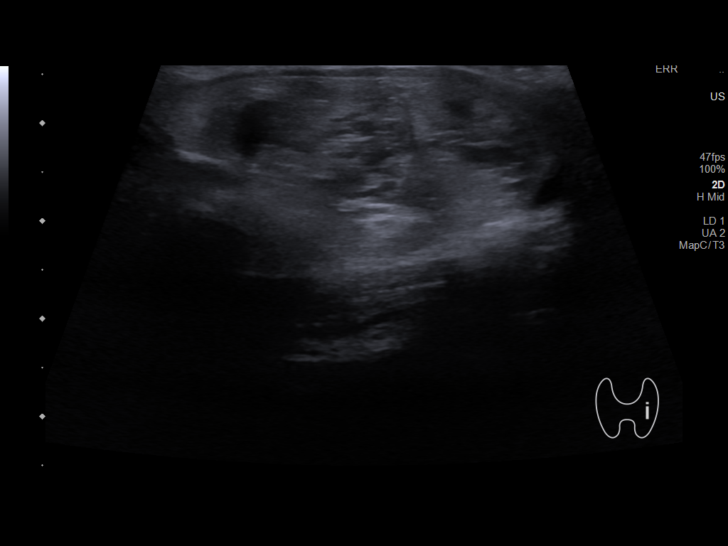
[im 53/71]
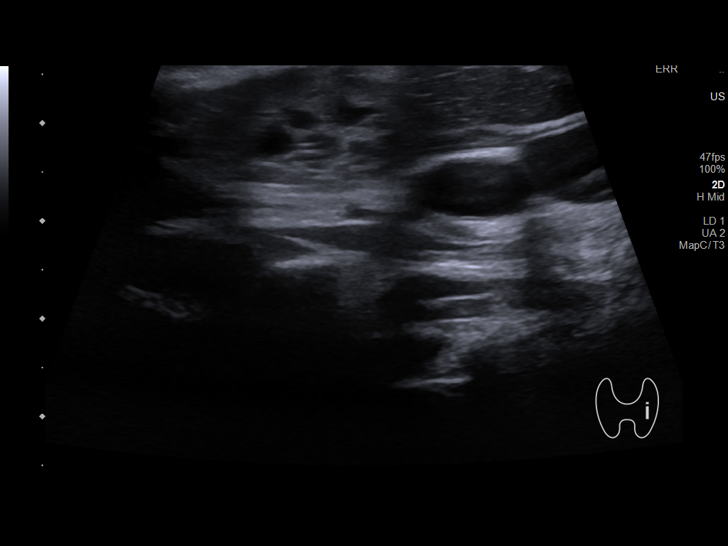
[im 59/71]
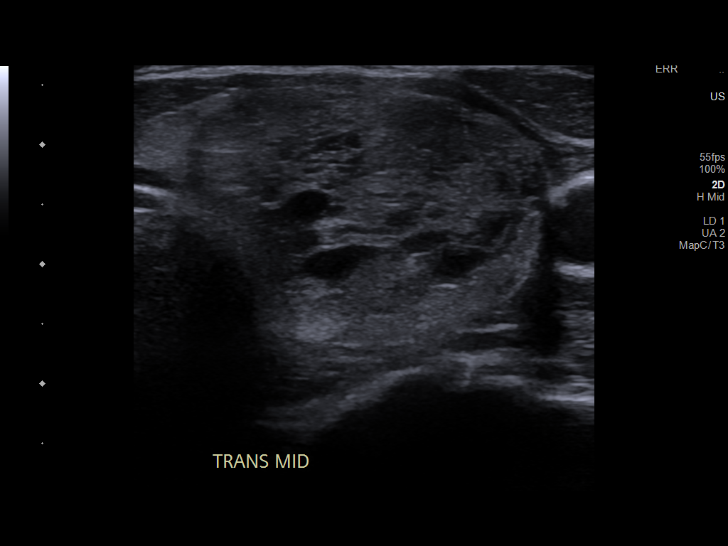
[im 65/71]
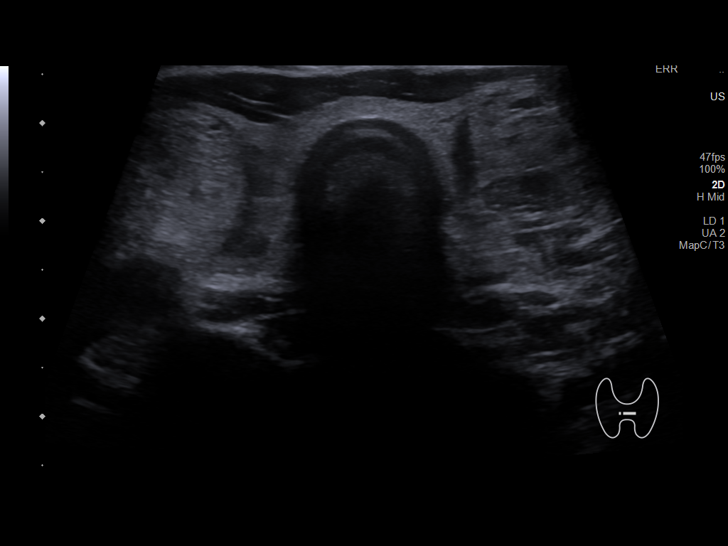
[im 71/71]
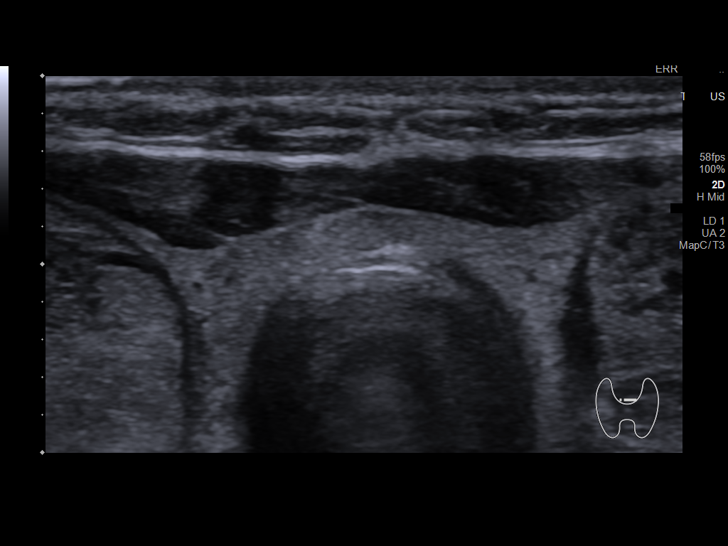

[13 of 25 positions shown; findings below may reference images not displayed]

FINDINGS: Parenchymal Echotexture: Mildly heterogenous

Isthmus: 0.2 cm thickness

Right lobe: 5.4 x 2.4 x 2.4 cm

Left lobe: 5 x 2.7 x 3 cm

_________________________________________________________

Estimated total number of nodules >/= 1 cm: 3

Number of spongiform nodules >/=  2 cm not described below (TR1): 0

Number of mixed cystic and solid nodules >/= 1.5 cm not described
below (TR2): 0

_________________________________________________________

Nodule # 1:

Location: Right; Mid

Maximum size: 3.5 cm; Other 2 dimensions: 2.2 x 2.5 cm

Composition: solid/almost completely solid (2)

Echogenicity: isoechoic (1)

Shape: not taller-than-wide (0)

Margins: smooth (0)

Echogenic foci: none (0)

ACR TI-RADS total points: 3.

ACR TI-RADS risk category: TR3 (3 points).

ACR TI-RADS recommendations:

**Given size (>/= 2.5 cm) and appearance, fine needle aspiration of
this mildly suspicious nodule should be considered based on TI-RADS
criteria.

_________________________________________________________

Nodule # 2:

Location: Right; Inferior

Maximum size: 1.2 cm; Other 2 dimensions: 1.1 x 0.9 cm

Composition: mixed cystic and solid (1)

Echogenicity: hypoechoic (2)

Shape: not taller-than-wide (0)

Margins: smooth (0)

Echogenic foci: none (0)

ACR TI-RADS total points: 3.

ACR TI-RADS risk category: TR3 (3 points).

ACR TI-RADS recommendations:

Given size (<1.4 cm) and appearance, this nodule does NOT meet
TI-RADS criteria for biopsy or dedicated follow-up.

_________________________________________________________

Nodule # 3:

Location: Left; Mid

Maximum size: 3.8 cm; Other 2 dimensions: 2.4 x 3 cm

Composition: mixed cystic and solid (1)

Echogenicity: hypoechoic (2)

Shape: not taller-than-wide (0)

Margins: smooth (0)

Echogenic foci: none (0)

ACR TI-RADS total points: 3.

ACR TI-RADS risk category: TR3 (3 points).

ACR TI-RADS recommendations:

**Given size (>/= 2.5 cm) and appearance, fine needle aspiration of
this mildly suspicious nodule should be considered based on TI-RADS
criteria.
IMPRESSION: 1. Thyromegaly with bilateral nodules.
2. Recommend FNA biopsy of mildly suspicious 3.8 cm mid left AND
cm mid right nodules.

The above is in keeping with the ACR TI-RADS recommendations - [HOSPITAL] 4129;[DATE].

## 2021-12-13 IMAGING — US US FNA BIOPSY THYROID 1ST LESION
1 series · 13 of 25 positions shown · non-contrast
Comparison: US 10/31/20

MEDICATIONS:
10 cc 1% lidocaine

COMPLICATIONS:
None immediate.

INDICATION: Indeterminate thyroid nodule

Right thyroid nodule: 3.5 cm
Left thyroid nodule: 3.8 cm
EXAM:
ULTRASOUND GUIDED FINE NEEDLE ASPIRATION OF INDETERMINATE THYROID
NODULE
TECHNIQUE: Informed written consent was obtained from the patient after a
discussion of the risks, benefits and alternatives to treatment.
Questions regarding the procedure were encouraged and answered. A
timeout was performed prior to the initiation of the procedure.

[Series 1: us fna bx thyroid ea add lesion afirma · 35 acquisitions, 13 frames shown]
[im 1/35]
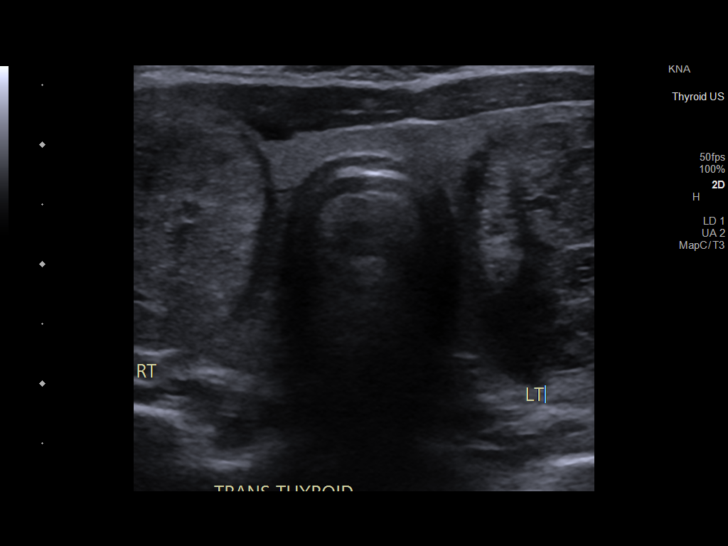
[im 3/35]
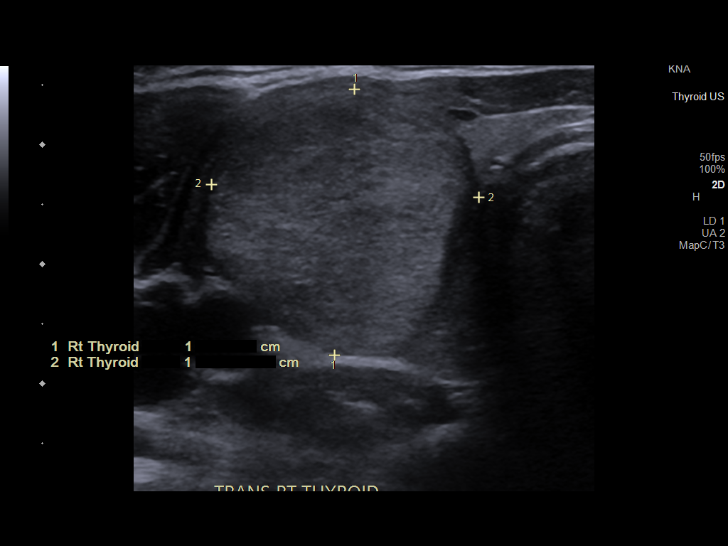
[im 6/35]
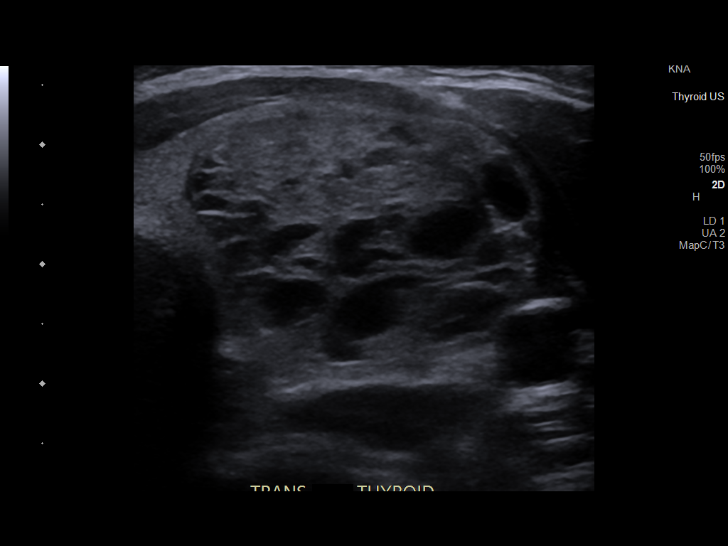
[im 9/35]
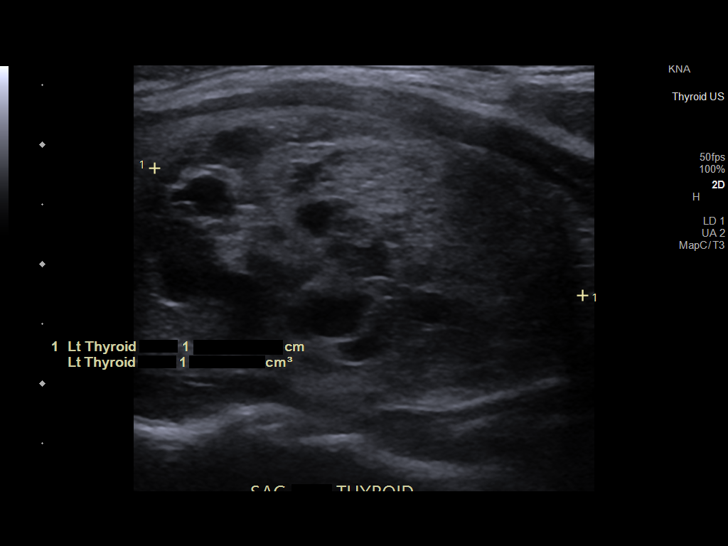
[im 12/35]
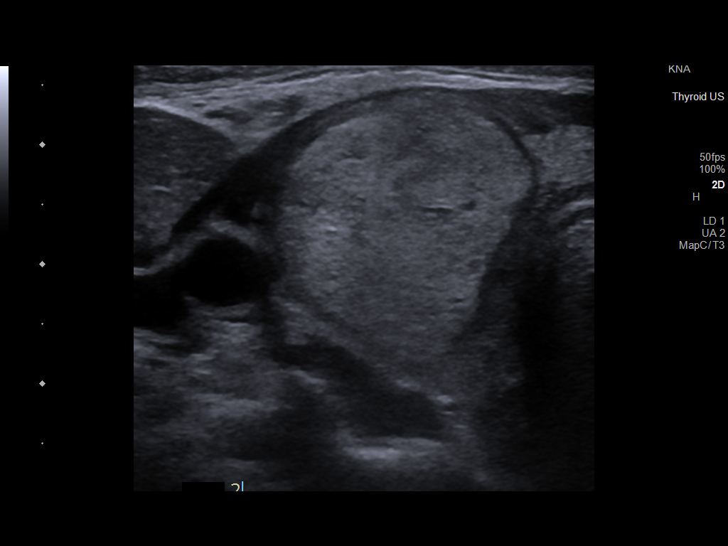
[im 15/35]
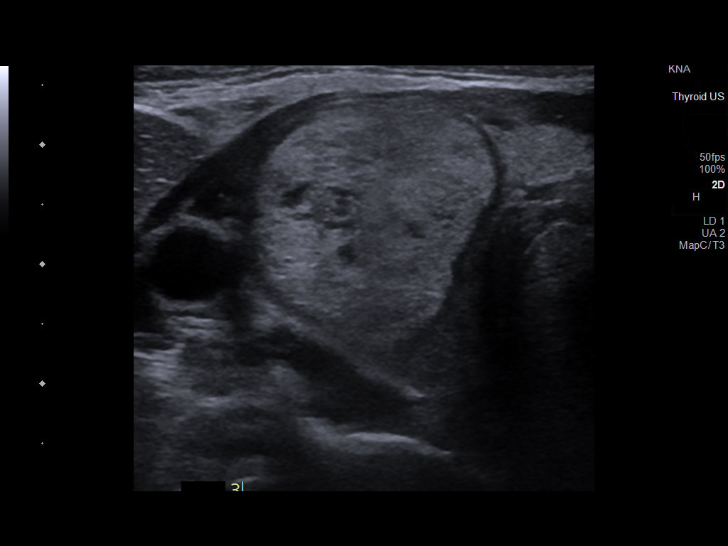
[im 18/35]
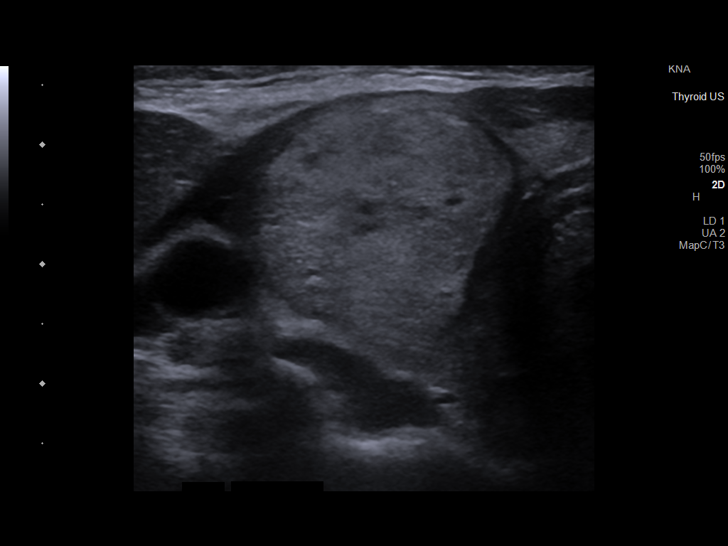
[im 20/35]
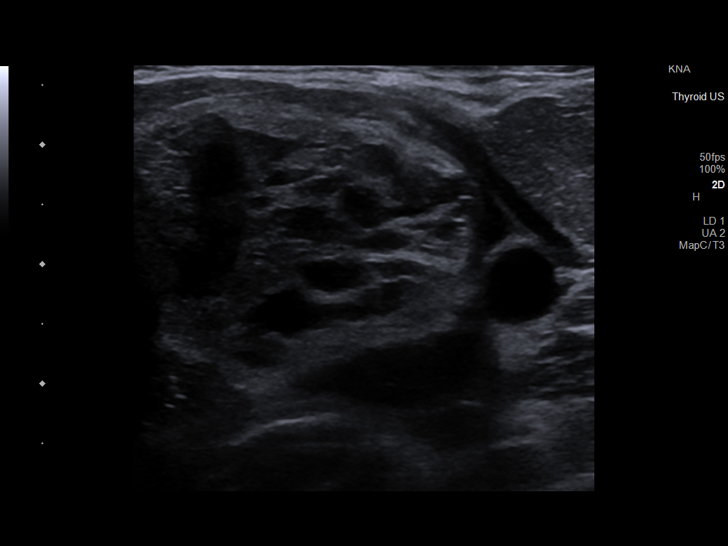
[im 23/35]
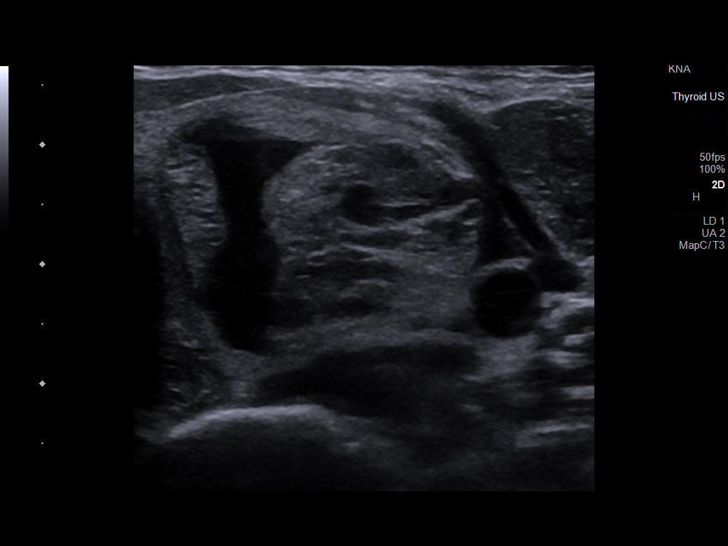
[im 26/35]
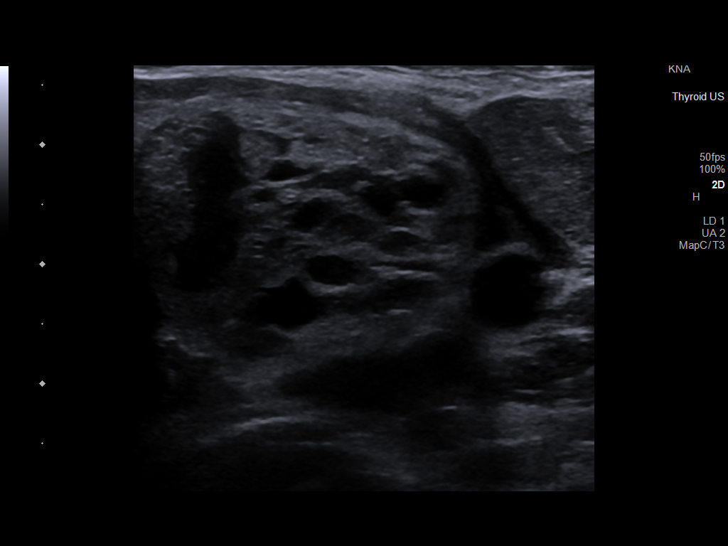
[im 29/35]
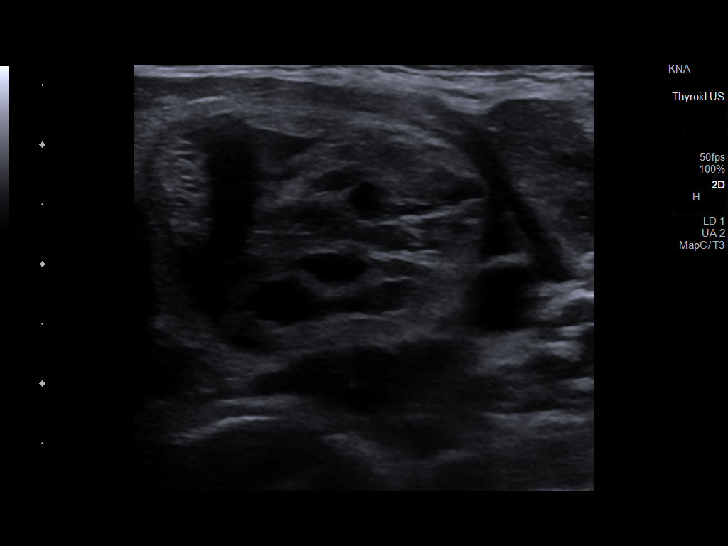
[im 32/35]
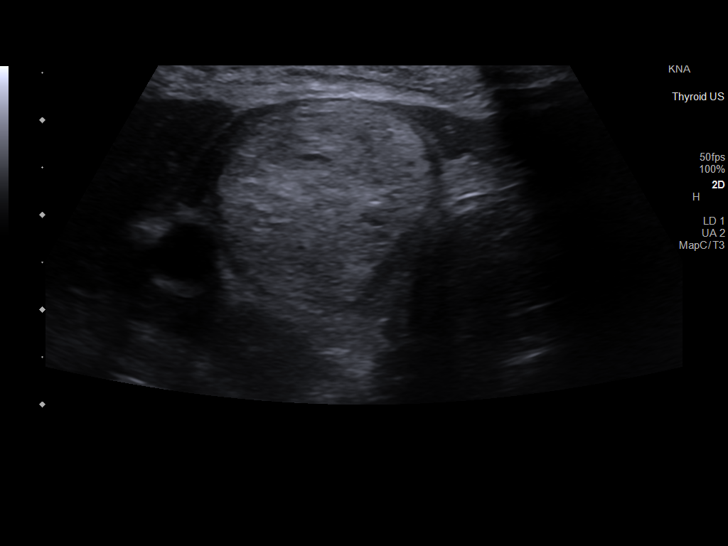
[im 35/35]
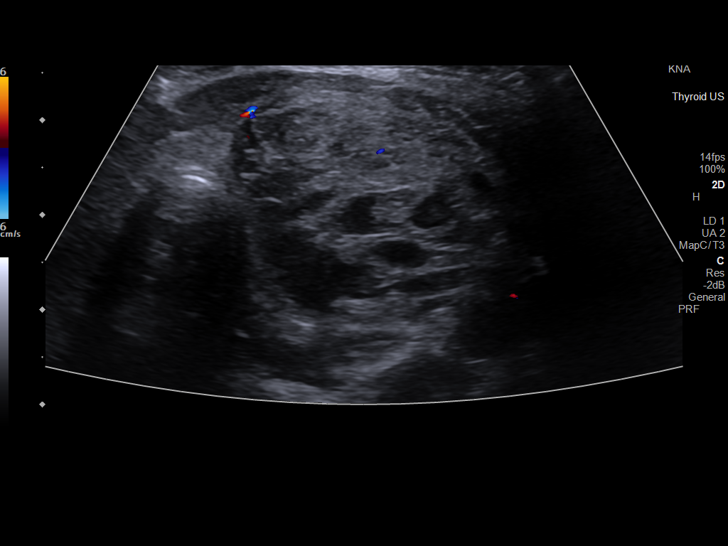

[13 of 25 positions shown; findings below may reference images not displayed]

Pre-procedural ultrasound scanning demonstrated unchanged size and
appearance of the indeterminate nodules within the right and left
thyroid

The procedure was planned. The neck was prepped in the usual sterile
fashion, and a sterile drape was applied covering the operative
field. A timeout was performed prior to the initiation of the
procedure. Local anesthesia was provided with 1% lidocaine.

Under direct ultrasound guidance, 5 FNA biopsies were performed of
the right mid lobe thyroid nodule with a 25 gauge needle.

2 of these samples were obtained for AFIRMA

Multiple ultrasound images were saved for procedural documentation
purposes. The samples were prepared and submitted to pathology.

Under direct ultrasound guidance, 5 FNA biopsies were performed of
the left mid lobe thyroid nodule with a 25 gauge needle.

2 of these samples were obtained for AFIRMA

Multiple ultrasound images were saved for procedural documentation
purposes. The samples were prepared and submitted to pathology.

Limited post procedural scanning was negative for hematoma or
additional complication. Dressings were placed. The patient
tolerated the above procedures procedure well without immediate
postprocedural complication.
FINDINGS: Nodule reference number based on prior diagnostic ultrasound: 1

Maximum size: 3.5 cm

Location: Right; Mid

ACR TI-RADS risk category: TR3 (3 points)

Reason for biopsy: meets ACR TI-RADS criteria

_________________________________________________________

Nodule reference number based on prior diagnostic ultrasound: 3

Maximum size: 3.8 cm

Location: Left; Mid

ACR TI-RADS risk category: TR3 (3 points)

Reason for biopsy: meets ACR TI-RADS criteria

Ultrasound imaging confirms appropriate placement of the needles
within the thyroid nodule.
IMPRESSION: 1. Technically successful ultrasound guided fine needle aspiration
of right mid lobe thyroid nodule.
2. Technically successful ultrasound guided fine needle aspiration
of left mid lobe thyroid nodule.

Read by

Angi Billiot

## 2021-12-13 IMAGING — US US BIOPSY FNA W/ IMAGING
1 series · 13 of 25 positions shown · non-contrast
Comparison: US 10/31/20

MEDICATIONS:
10 cc 1% lidocaine

COMPLICATIONS:
None immediate.

INDICATION: Indeterminate thyroid nodule

Right thyroid nodule: 3.5 cm
Left thyroid nodule: 3.8 cm
EXAM:
ULTRASOUND GUIDED FINE NEEDLE ASPIRATION OF INDETERMINATE THYROID
NODULE
TECHNIQUE: Informed written consent was obtained from the patient after a
discussion of the risks, benefits and alternatives to treatment.
Questions regarding the procedure were encouraged and answered. A
timeout was performed prior to the initiation of the procedure.

[Series 1: us fna bx thyroid ea add lesion afirma · 35 acquisitions, 13 frames shown]
[im 1/35]
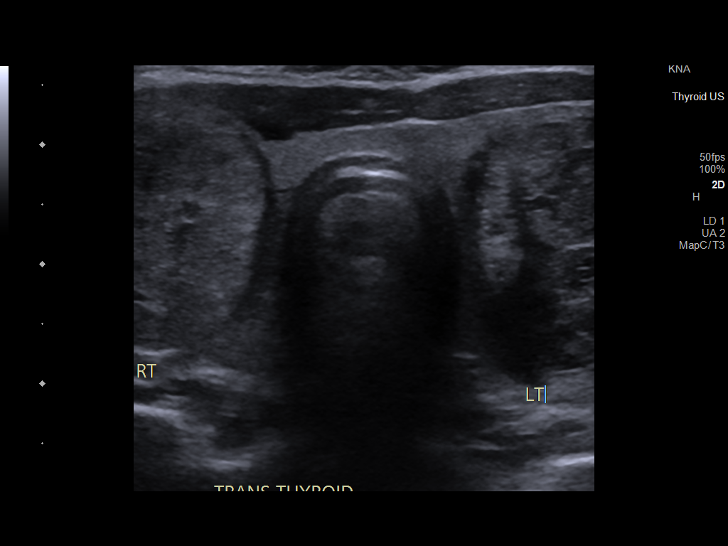
[im 3/35]
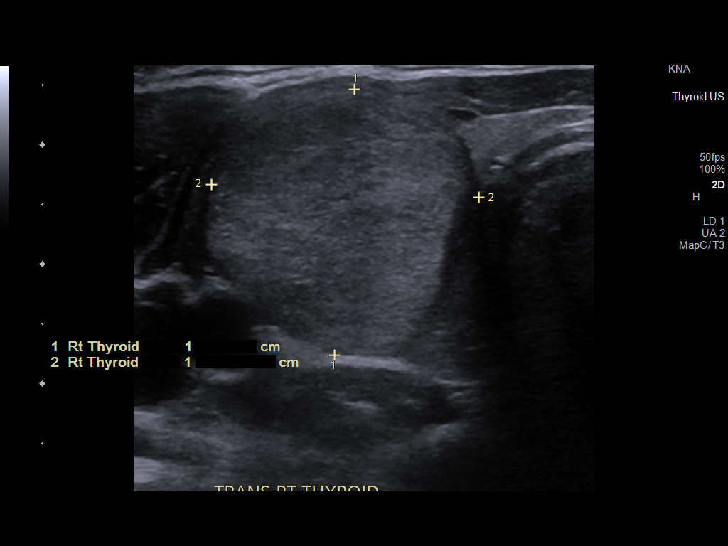
[im 6/35]
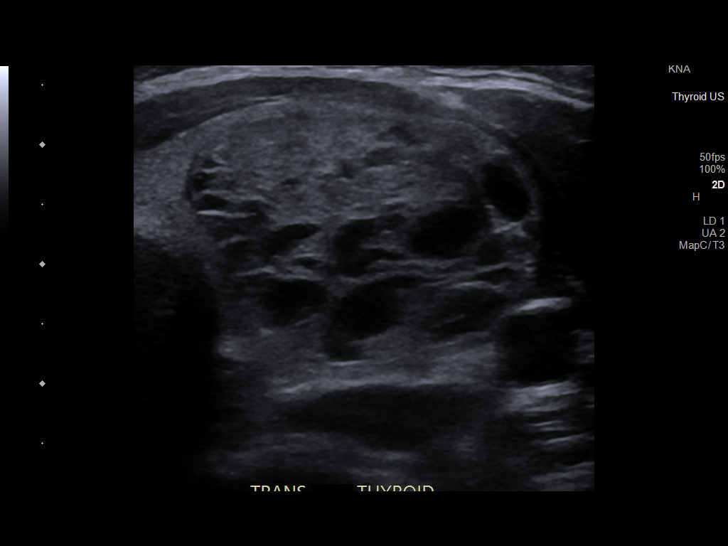
[im 9/35]
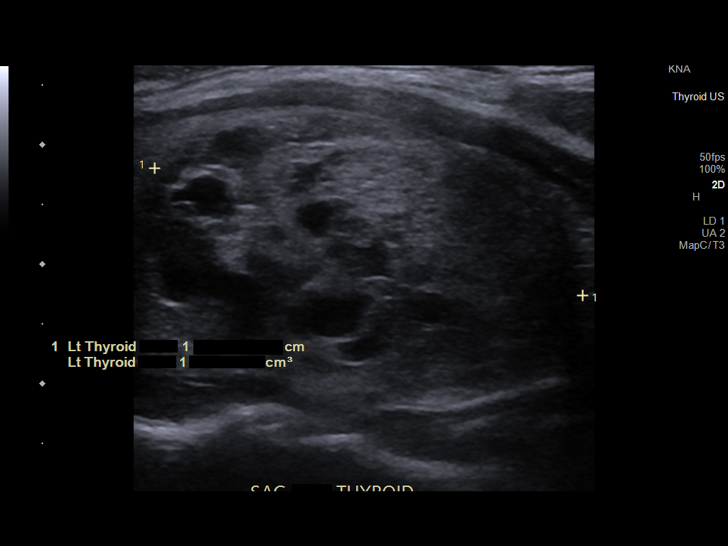
[im 12/35]
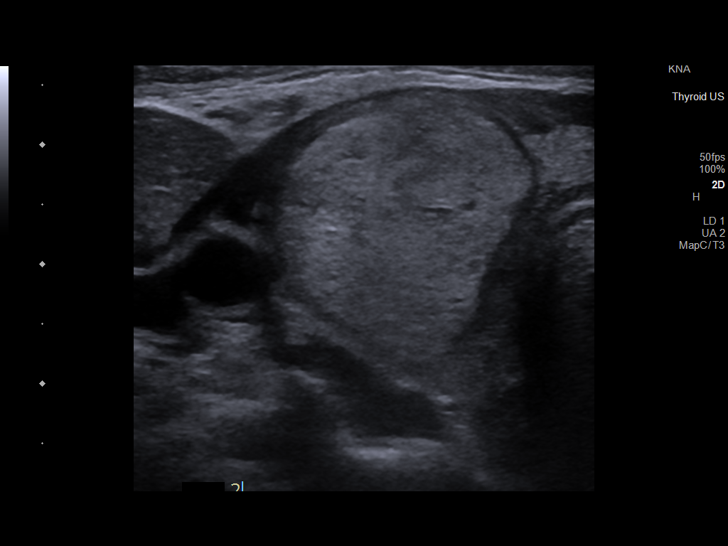
[im 15/35]
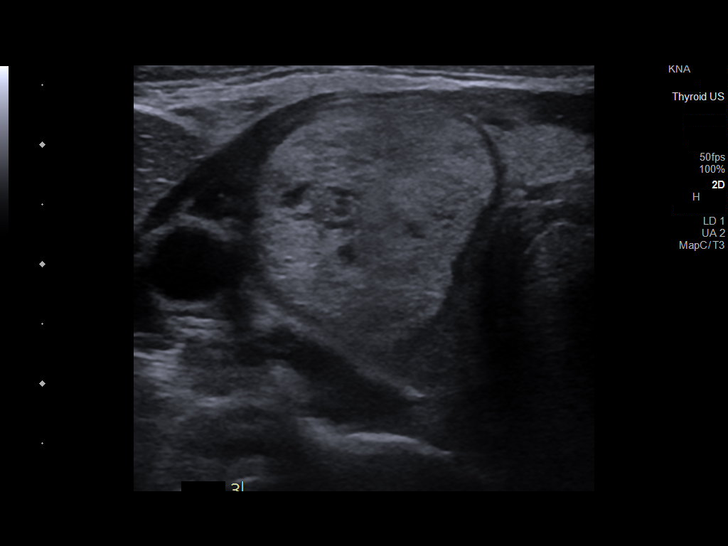
[im 18/35]
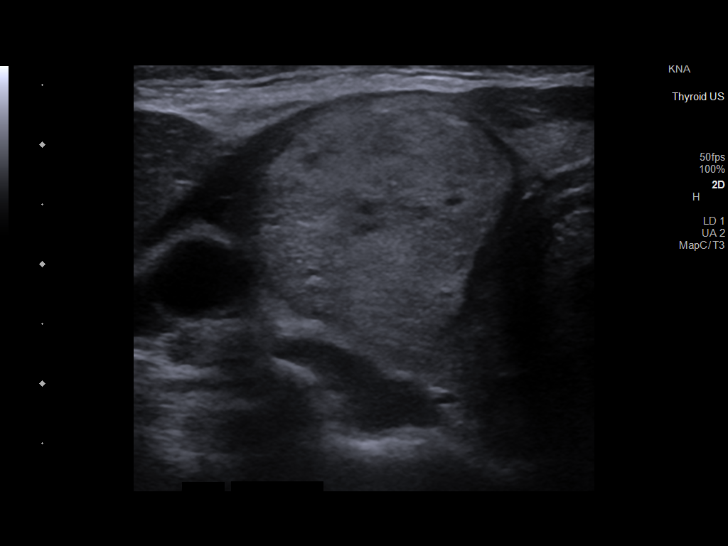
[im 20/35]
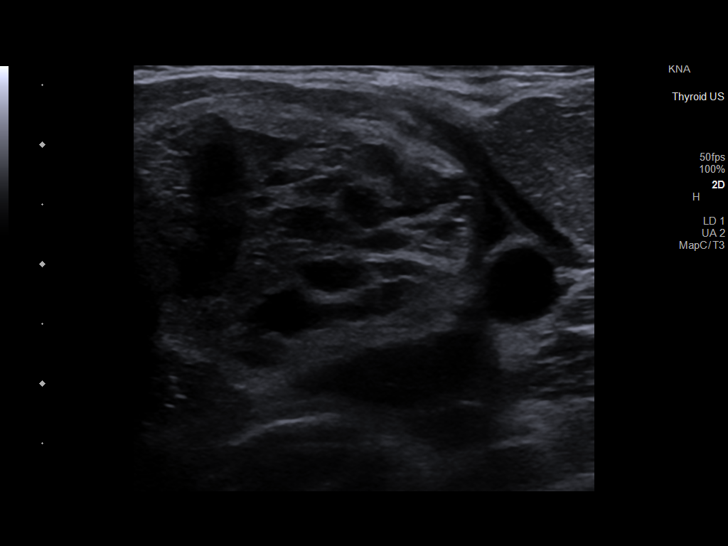
[im 23/35]
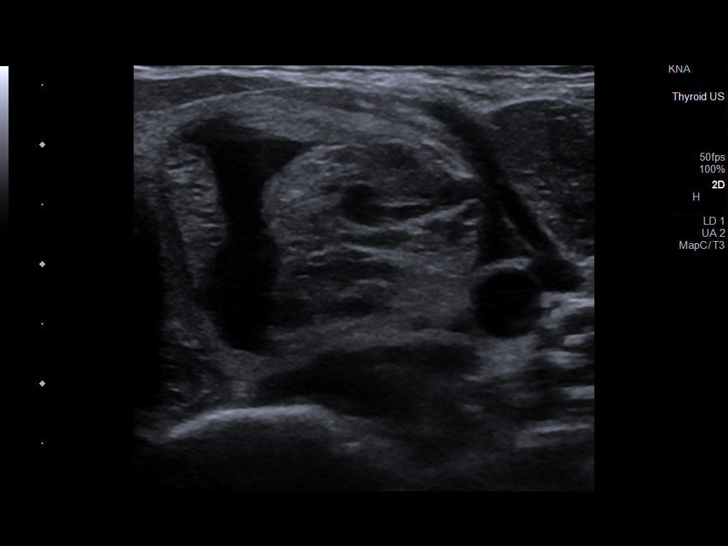
[im 26/35]
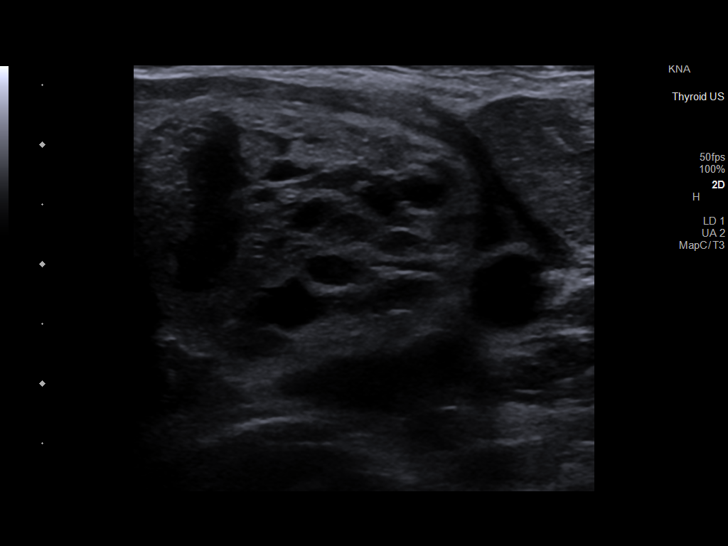
[im 29/35]
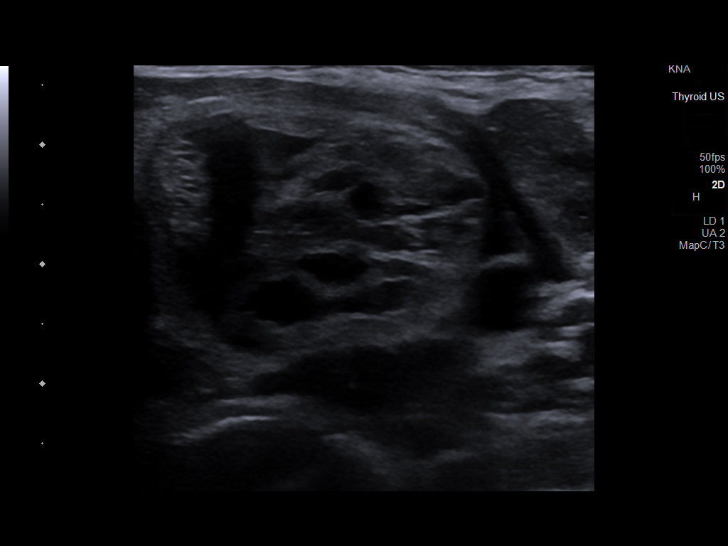
[im 32/35]
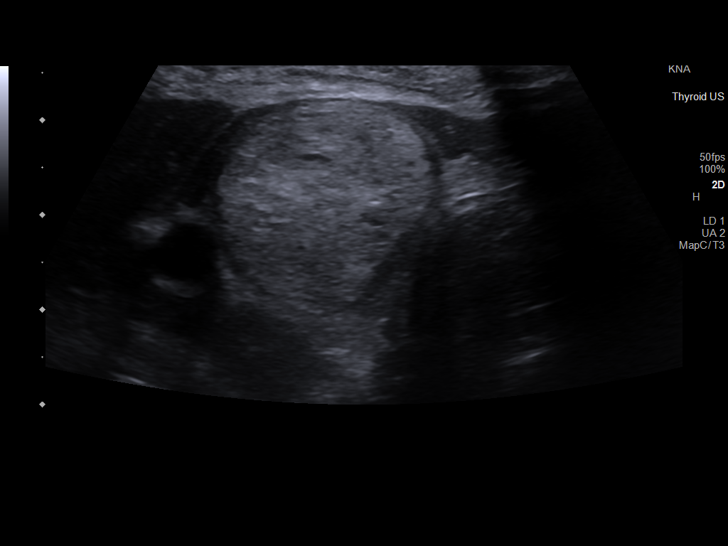
[im 35/35]
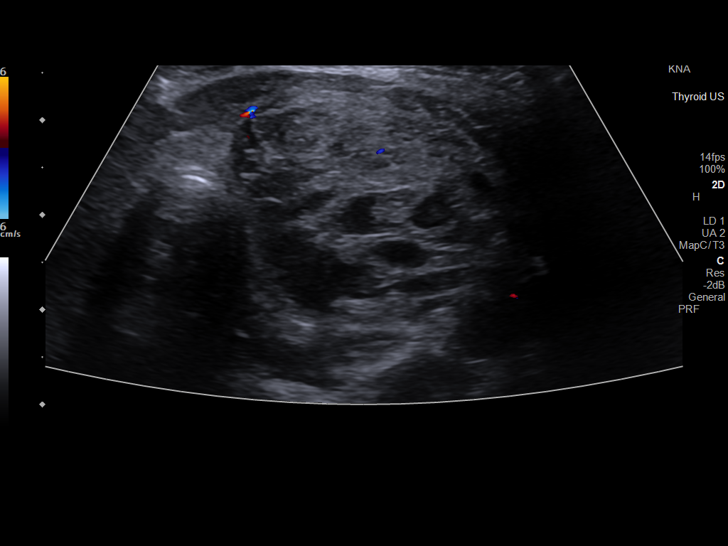

[13 of 25 positions shown; findings below may reference images not displayed]

Pre-procedural ultrasound scanning demonstrated unchanged size and
appearance of the indeterminate nodules within the right and left
thyroid

The procedure was planned. The neck was prepped in the usual sterile
fashion, and a sterile drape was applied covering the operative
field. A timeout was performed prior to the initiation of the
procedure. Local anesthesia was provided with 1% lidocaine.

Under direct ultrasound guidance, 5 FNA biopsies were performed of
the right mid lobe thyroid nodule with a 25 gauge needle.

2 of these samples were obtained for AFIRMA

Multiple ultrasound images were saved for procedural documentation
purposes. The samples were prepared and submitted to pathology.

Under direct ultrasound guidance, 5 FNA biopsies were performed of
the left mid lobe thyroid nodule with a 25 gauge needle.

2 of these samples were obtained for AFIRMA

Multiple ultrasound images were saved for procedural documentation
purposes. The samples were prepared and submitted to pathology.

Limited post procedural scanning was negative for hematoma or
additional complication. Dressings were placed. The patient
tolerated the above procedures procedure well without immediate
postprocedural complication.
FINDINGS: Nodule reference number based on prior diagnostic ultrasound: 1

Maximum size: 3.5 cm

Location: Right; Mid

ACR TI-RADS risk category: TR3 (3 points)

Reason for biopsy: meets ACR TI-RADS criteria

_________________________________________________________

Nodule reference number based on prior diagnostic ultrasound: 3

Maximum size: 3.8 cm

Location: Left; Mid

ACR TI-RADS risk category: TR3 (3 points)

Reason for biopsy: meets ACR TI-RADS criteria

Ultrasound imaging confirms appropriate placement of the needles
within the thyroid nodule.
IMPRESSION: 1. Technically successful ultrasound guided fine needle aspiration
of right mid lobe thyroid nodule.
2. Technically successful ultrasound guided fine needle aspiration
of left mid lobe thyroid nodule.

Read by

Angi Billiot

## 2022-03-07 ENCOUNTER — Telehealth: Payer: Self-pay | Admitting: Family Medicine

## 2022-03-07 DIAGNOSIS — Z3009 Encounter for other general counseling and advice on contraception: Secondary | ICD-10-CM

## 2022-03-10 NOTE — Telephone Encounter (Signed)
Sent to [facility] - Freight forwarder to forward where ever it needs to go ?

## 2022-03-12 NOTE — Telephone Encounter (Signed)
Please contact pt to have her schedule appt for medications. Thank you.  ?

## 2022-03-12 NOTE — Telephone Encounter (Signed)
Routing to [facility] Clinical pool. ?

## 2022-03-13 NOTE — Telephone Encounter (Signed)
Tried calling patient no answer and voice mail not setup ?

## 2022-04-10 NOTE — Telephone Encounter (Signed)
Patient has an appt on 04/17/22 ?

## 2022-04-15 ENCOUNTER — Ambulatory Visit: Payer: BC Managed Care – PPO | Admitting: Nurse Practitioner

## 2022-04-17 ENCOUNTER — Ambulatory Visit: Payer: BC Managed Care – PPO | Admitting: Family Medicine

## 2022-04-17 VITALS — BP 129/72 | HR 74 | Ht 66.0 in | Wt 168.0 lb

## 2022-04-17 DIAGNOSIS — Z3009 Encounter for other general counseling and advice on contraception: Secondary | ICD-10-CM | POA: Diagnosis not present

## 2022-04-17 DIAGNOSIS — E049 Nontoxic goiter, unspecified: Secondary | ICD-10-CM

## 2022-04-17 DIAGNOSIS — Z30011 Encounter for initial prescription of contraceptive pills: Secondary | ICD-10-CM | POA: Diagnosis not present

## 2022-04-17 MED ORDER — LEVONORGESTREL-ETHINYL ESTRAD 0.1-20 MG-MCG PO TABS: ORAL_TABLET | ORAL | 2 refills | Status: DC

## 2022-04-17 NOTE — Patient Instructions (Signed)
Labs when you leave.  We will call regarding ultrasound.  Take care  Dr. Lacinda Axon

## 2022-04-18 DIAGNOSIS — E049 Nontoxic goiter, unspecified: Secondary | ICD-10-CM | POA: Insufficient documentation

## 2022-04-18 DIAGNOSIS — Z30011 Encounter for initial prescription of contraceptive pills: Secondary | ICD-10-CM | POA: Insufficient documentation

## 2022-04-18 LAB — T3, FREE: T3, Free: 2.7 pg/mL (ref 2.0–4.4)

## 2022-04-18 LAB — TSH+FREE T4
Free T4: 0.91 ng/dL (ref 0.82–1.77)
TSH: 1.07 u[IU]/mL (ref 0.450–4.500)

## 2022-04-18 NOTE — Progress Notes (Signed)
Subjective:  Patient ID: Lisa Moses, female    DOB: [birth year]  Age: 26 y.o. MRN: [id number]  CC: Chief Complaint  Patient presents with   Contraception    Follow up- no problems with current BCP- would like refills  Patient would also like thyroid rechecked    HPI:  26 year old female presents for evaluation of the above.  Patient is on an oral contraceptive.  She states that she is doing well.  Periods are regular and flow is normal.  She is in need of a refill.  Patient has a history of thyromegaly.  She is also had a previous biopsy of the thyroid which revealed a benign follicular nodule.  Patient states that her mother is concerned about her thyroid and would like it to be examined.  She denies any trouble swallowing.  She denies any hyperthyroid or hypothyroid symptoms.  Patient Active Problem List   Diagnosis Date Noted   Oral contraceptive prescribed 04/18/2022   Goiter 04/18/2022    Social Hx   Social History   Socioeconomic History   Marital status: Single    Spouse name: Not on file   Number of children: Not on file   Years of education: Not on file   Highest education level: Not on file  Occupational History   Not on file  Tobacco Use   Smoking status: Never   Smokeless tobacco: Never  Vaping Use   Vaping Use: Never used  Substance and Sexual Activity   Alcohol use: Never   Drug use: Never   Sexual activity: Not on file  Other Topics Concern   Not on file  Social History Narrative   Not on file   Social Determinants of Health   Financial Resource Strain: Not on file  Food Insecurity: Not on file  Transportation Needs: Not on file  Physical Activity: Not on file  Stress: Not on file  Social Connections: Not on file    Review of Systems Per HPI  Objective:  BP 129/72   Pulse 74   Ht '5\' 6"'$  (1.676 m)   Wt 168 lb (76.2 kg)   BMI 27.12 kg/m      04/17/2022    3:20 PM 01/10/2021   11:45 AM 01/10/2021   11:25 AM  BP/Weight   Systolic BP 097 353 96  Diastolic BP 72 72 79  Wt. (Lbs) 168    BMI 27.12 kg/m2      Physical Exam Vitals and nursing note reviewed.  Constitutional:      General: She is not in acute distress.    Appearance: Normal appearance.  HENT:     Head: Normocephalic and atraumatic.  Eyes:     General:        Right eye: No discharge.        Left eye: No discharge.     Conjunctiva/sclera: Conjunctivae normal.  Neck:     Comments: Goiter noted. Cardiovascular:     Rate and Rhythm: Normal rate and regular rhythm.     Heart sounds: No murmur heard. Pulmonary:     Effort: Pulmonary effort is normal.     Breath sounds: Normal breath sounds. No wheezing, rhonchi or rales.  Neurological:     Mental Status: She is alert.  Psychiatric:        Mood and Affect: Mood normal.        Behavior: Behavior normal.    Lab Results  Component Value Date   WBC 5.9 05/21/2020  HGB 11.9 05/21/2020   HCT 37.0 05/21/2020   PLT 315 05/21/2020   GLUCOSE 93 05/21/2020   ALT 10 05/21/2020   AST 12 05/21/2020   NA 139 05/21/2020   K 4.1 05/21/2020   CL 105 05/21/2020   CREATININE 0.79 05/21/2020   BUN 10 05/21/2020   CO2 22 05/21/2020   TSH 1.070 04/17/2022     Assessment & Plan:   Problem List Items Addressed This Visit       Endocrine   Goiter - Primary    Goiter noted on exam.  Labs today.  Arranging for ultrasound.       Relevant Orders   TSH + free T4 (Completed)   T3, free (Completed)   [place] THYROID     Other   RESOLVED: Birth control counseling   Relevant Medications   levonorgestrel-ethinyl estradiol (SRONYX) 0.1-20 MG-MCG tablet   Oral contraceptive prescribed    Doing well.  Medication was refilled today.        Meds ordered this encounter  Medications   levonorgestrel-ethinyl estradiol (SRONYX) 0.1-20 MG-MCG tablet    Sig: CHEW 1 TABLET BY MOUTH EVERY DAY    Dispense:  84 tablet    Refill:  2    DX Code Needed  PT NEEDS REFILLS.   [place]

## 2022-04-18 NOTE — Assessment & Plan Note (Signed)
Doing well.  Medication was refilled today.

## 2022-04-18 NOTE — Assessment & Plan Note (Signed)
Goiter noted on exam.  Labs today.  Arranging for ultrasound.

## 2022-05-02 ENCOUNTER — Ambulatory Visit (HOSPITAL_COMMUNITY): Payer: BC Managed Care – PPO

## 2022-05-05 ENCOUNTER — Ambulatory Visit (HOSPITAL_COMMUNITY): Payer: BC Managed Care – PPO

## 2022-05-06 ENCOUNTER — Ambulatory Visit (HOSPITAL_COMMUNITY): Payer: BC Managed Care – PPO

## 2022-05-07 ENCOUNTER — Encounter: Payer: Self-pay | Admitting: *Deleted

## 2022-12-27 ENCOUNTER — Other Ambulatory Visit: Payer: Self-pay | Admitting: Family Medicine

## 2022-12-27 DIAGNOSIS — Z3009 Encounter for other general counseling and advice on contraception: Secondary | ICD-10-CM

## 2023-03-19 ENCOUNTER — Other Ambulatory Visit: Payer: Self-pay | Admitting: Family Medicine

## 2023-03-19 DIAGNOSIS — Z3009 Encounter for other general counseling and advice on contraception: Secondary | ICD-10-CM

## 2023-04-15 ENCOUNTER — Other Ambulatory Visit: Payer: Self-pay | Admitting: Family Medicine

## 2023-04-15 DIAGNOSIS — Z3009 Encounter for other general counseling and advice on contraception: Secondary | ICD-10-CM

## 2023-11-10 ENCOUNTER — Ambulatory Visit (INDEPENDENT_AMBULATORY_CARE_PROVIDER_SITE_OTHER): Payer: BC Managed Care – PPO | Admitting: Family Medicine

## 2023-11-10 ENCOUNTER — Encounter: Payer: Self-pay | Admitting: Family Medicine

## 2023-11-10 VITALS — BP 135/81 | HR 102 | Ht 66.0 in | Wt 164.1 lb

## 2023-11-10 DIAGNOSIS — E038 Other specified hypothyroidism: Secondary | ICD-10-CM

## 2023-11-10 DIAGNOSIS — E559 Vitamin D deficiency, unspecified: Secondary | ICD-10-CM

## 2023-11-10 DIAGNOSIS — E049 Nontoxic goiter, unspecified: Secondary | ICD-10-CM | POA: Diagnosis not present

## 2023-11-10 DIAGNOSIS — Z1159 Encounter for screening for other viral diseases: Secondary | ICD-10-CM

## 2023-11-10 DIAGNOSIS — Z30011 Encounter for initial prescription of contraceptive pills: Secondary | ICD-10-CM | POA: Diagnosis not present

## 2023-11-10 DIAGNOSIS — E7849 Other hyperlipidemia: Secondary | ICD-10-CM

## 2023-11-10 DIAGNOSIS — R7301 Impaired fasting glucose: Secondary | ICD-10-CM | POA: Diagnosis not present

## 2023-11-10 DIAGNOSIS — Z114 Encounter for screening for human immunodeficiency virus [HIV]: Secondary | ICD-10-CM

## 2023-11-10 MED ORDER — LEVONORGESTREL-ETHINYL ESTRAD 0.1-20 MG-MCG PO TABS
ORAL_TABLET | ORAL | 0 refills | Status: DC
Start: 2023-11-10 — End: 2024-02-24

## 2023-11-10 NOTE — Assessment & Plan Note (Addendum)
The patient has a history of thyromegaly, and a previous biopsy of the thyroid revealed benign follicular nodules. She reports that her mother is concerned about her thyroid and would like it to be examined. The patient denies any trouble swallowing and any symptoms of hyperthyroidism or hypothyroidism.  A thyroid ultrasound will be performed today to assess the size and any abnormalities of the thyroid.

## 2023-11-10 NOTE — Progress Notes (Signed)
New Patient Office Visit  Subjective:  Patient ID: Lisa Moses, female    DOB: 02-09-1996  Age: 27 y.o. MRN: [id number]  CC:  Chief Complaint  Patient presents with   New Patient (Initial Visit)    Establishing care, needs refills on birth control, and needs thyroid checked.    Nasal Congestion    Pt reports a cold over thanksgiving break, cold is breaking up.     HPI Lisa Moses is a 27 y.o. female with past medical history of Goiter presents for establishing care. For the details of today's visit, please refer to the assessment and plan.   History reviewed. No pertinent past medical history.  History reviewed. No pertinent surgical history.  History reviewed. No pertinent family history.  Social History   Socioeconomic History   Marital status: Single    Spouse name: Not on file   Number of children: Not on file   Years of education: Not on file   Highest education level: Not on file  Occupational History   Not on file  Tobacco Use   Smoking status: Never   Smokeless tobacco: Never  Vaping Use   Vaping status: Never Used  Substance and Sexual Activity   Alcohol use: Never   Drug use: Never   Sexual activity: Not on file  Other Topics Concern   Not on file  Social History Narrative   Not on file   Social Determinants of Health   Financial Resource Strain: Not on file  Food Insecurity: Not on file  Transportation Needs: Not on file  Physical Activity: Not on file  Stress: Not on file  Social Connections: Not on file  Intimate Partner Violence: Not on file    ROS Review of Systems  Constitutional:  Negative for appetite change, chills, fatigue and fever.  Eyes:  Negative for visual disturbance.  Respiratory:  Negative for chest tightness and shortness of breath.   Endocrine: Negative for cold intolerance and heat intolerance.  Musculoskeletal:  Negative for neck pain.  Neurological:  Negative for dizziness and headaches.    Objective:    Today's Vitals: BP 135/81   Pulse (!) 102   Ht 5\' 6"  (1.676 m)   Wt 164 lb 1.9 oz (74.4 kg)   SpO2 98%   BMI 26.49 kg/m   Physical Exam HENT:     Head: Normocephalic.     Mouth/Throat:     Mouth: Mucous membranes are moist.  Neck:     Thyroid: Thyromegaly present.  Cardiovascular:     Rate and Rhythm: Normal rate.     Heart sounds: Normal heart sounds.  Pulmonary:     Effort: Pulmonary effort is normal.     Breath sounds: Normal breath sounds.  Neurological:     Mental Status: She is alert.      Assessment & Plan:   Goiter Assessment & Plan: The patient has a history of thyromegaly, and a previous biopsy of the thyroid revealed benign follicular nodules. She reports that her mother is concerned about her thyroid and would like it to be examined. The patient denies any trouble swallowing and any symptoms of hyperthyroidism or hypothyroidism.  A thyroid ultrasound will be performed today to assess the size and any abnormalities of the thyroid.   Orders: -     US THYROID  Oral contraceptive prescribed Assessment & Plan: Negative pregnancy test in the clinic Reviewed warning signs of oral contraceptive use, including severe abdominal pain, severe sharp chest  pain with shortness of breath, severe headaches unrelieved by ibuprofen or Tylenol, blurred vision or spots in her vision (scotoma), and severe leg pain in the calf or thigh   Orders: -     Pregnancy, urine -     Levonorgestrel-Ethinyl Estrad; CHEW 1 TABLET BY MOUTH EVERY DAY  Dispense: 84 tablet; Refill: 0  IFG (impaired fasting glucose) -     Hemoglobin A1c  Vitamin D deficiency -     VITAMIN D 25 Hydroxy (Vit-D Deficiency, Fractures)  Need for hepatitis C screening test -     Hepatitis C antibody  Encounter for screening for HIV -     HIV Antibody (routine testing w rflx)  TSH (thyroid-stimulating hormone deficiency) -     TSH + free T4  Other hyperlipidemia -     Lipid panel -     CMP14+EGFR -      CBC with Differential/Platelet   Note: This chart has been completed using Engineer, civil (consulting) software, and while attempts have been made to ensure accuracy, certain words and phrases may not be transcribed as intended.    Follow-up: Return in about 2 months (around 01/11/2024) for pap smear.   Gilmore Laroche, FNP

## 2023-11-10 NOTE — Patient Instructions (Addendum)
I appreciate the opportunity to provide care to you today!  Follow up:  2 months for pap  Labs: please stop by the lab today to get your blood drawn (CBC, CMP, TSH, Lipid profile, HgA1c, Vit D)  Goiter An ultrasound of the thyroid gland to assess the size, shape, and any abnormalities of the thyroid, including goiters.  Oral Contraceptives  Warning signs (ACHES) Abdominal pain (severe) Chest pain (sharp, severe, shortness of breath) Headache (severe, dizziness, unilateral) Eye problems (scotoma, blurred vision, blind spots) Severe leg pain (calf or thigh)  Instructions for use Quick start method-reasonably certain not pregnant, take first pill on day of office visit; backup method for 7 days if more than 5 days since last menstrual period First day start-take first pill on first day of menses; no backup method needed Take pill at approximately same time each day If nausea occurs, take pill with meals or at bedtime Use backup method (condoms) if efficacy/absorbency compromised by severe vomiting and or diarrhea Use condoms for prevention of STIs and HIV  Recommendations for Late or Missed Pills If one hormonal pills late: (<24 to <48 hours since a pill should have been taken) take delay on these pills and is possible.  Continue taking the remaining pills at the usual time you may have a means taking 2 pills on the same day.  No additional contraceptive protection as needed.  If 2 or more consecutive hormonal pills have been missed(>48 hours since the pills you have been taken) take the most recent missed pills once possible.  And other means pills should be discarded.  Continue taking the remaining pills at the usual time even if it means taking 2 pills on the same day.  Use backup contraceptive (e.g. condoms) or avoid sexual intercourse until hormonal pills have been taking for 7 consecutive days.  If pills were missing the last week of hormonal pills (e.g, days 15-21 for 28 day pill  packs): -Omit the hormone free interval by physician no hormonal pills in the current pack and starting a new pack the next day. -If unable to start a new pack immediately, use backup contraceptive (e.g condoms) or voices during the course of the hormonal pills from her new pack has been taking for 7 consecutive days.  Emergency contraception should be considered if hormonal pills were missed during the first week and unprotected sexual intercourse occurred in the previous 5 days.  Common side effects/adverse effects Nausea, increased breast size/breast tenderness, increased blood pressure, venous thromboembolism(VTE), fatigue, depressive symptoms, cyclic weight gain    Attached with your AVS, you will find valuable resources for self-education. I highly recommend dedicating some time to thoroughly examine them.   Please continue to a heart-healthy diet and increase your physical activities. Try to exercise for at least five days a week.    It was a pleasure to see you and I look forward to continuing to work together on your health and well-being. Please do not hesitate to call the office if you need care or have questions about your care.  In case of emergency, please visit the Emergency Department for urgent care, or contact our clinic at [phone] to schedule an appointment. We're here to help you!   Have a wonderful day and week. With Gratitude, Gilmore Laroche MSN, FNP-BC

## 2023-11-10 NOTE — Assessment & Plan Note (Signed)
Negative pregnancy test in the clinic Reviewed warning signs of oral contraceptive use, including severe abdominal pain, severe sharp chest pain with shortness of breath, severe headaches unrelieved by ibuprofen or Tylenol, blurred vision or spots in her vision (scotoma), and severe leg pain in the calf or thigh

## 2023-11-12 LAB — PREGNANCY, URINE: Preg Test, Ur: NEGATIVE

## 2023-11-16 ENCOUNTER — Ambulatory Visit (HOSPITAL_COMMUNITY): Payer: BC Managed Care – PPO | Attending: Family Medicine

## 2024-02-02 ENCOUNTER — Ambulatory Visit: Payer: BC Managed Care – PPO | Admitting: Family Medicine

## 2024-02-24 ENCOUNTER — Other Ambulatory Visit: Payer: Self-pay | Admitting: Family Medicine

## 2024-02-24 DIAGNOSIS — Z30011 Encounter for initial prescription of contraceptive pills: Secondary | ICD-10-CM

## 2024-02-27 MED ORDER — LEVONORGESTREL-ETHINYL ESTRAD 0.1-20 MG-MCG PO TABS
1.0000 | ORAL_TABLET | Freq: Every day | ORAL | 0 refills | Status: DC
  Filled 2024-02-27: qty 84, 84d supply, fill #0

## 2024-02-29 ENCOUNTER — Other Ambulatory Visit: Payer: Self-pay

## 2024-05-16 ENCOUNTER — Other Ambulatory Visit: Payer: Self-pay | Admitting: Family Medicine

## 2024-05-16 DIAGNOSIS — Z30011 Encounter for initial prescription of contraceptive pills: Secondary | ICD-10-CM

## 2024-05-19 ENCOUNTER — Other Ambulatory Visit (HOSPITAL_COMMUNITY): Payer: Self-pay

## 2024-05-24 ENCOUNTER — Other Ambulatory Visit: Payer: Self-pay | Admitting: Family Medicine

## 2024-05-24 DIAGNOSIS — Z30011 Encounter for initial prescription of contraceptive pills: Secondary | ICD-10-CM

## 2024-05-25 ENCOUNTER — Other Ambulatory Visit: Payer: Self-pay | Admitting: Family Medicine

## 2024-05-25 DIAGNOSIS — Z30011 Encounter for initial prescription of contraceptive pills: Secondary | ICD-10-CM

## 2024-06-01 ENCOUNTER — Other Ambulatory Visit (HOSPITAL_COMMUNITY): Payer: Self-pay

## 2024-06-02 ENCOUNTER — Other Ambulatory Visit: Payer: Self-pay | Admitting: Family Medicine

## 2024-06-02 ENCOUNTER — Other Ambulatory Visit: Payer: Self-pay

## 2024-06-02 ENCOUNTER — Other Ambulatory Visit (HOSPITAL_COMMUNITY): Payer: Self-pay

## 2024-06-02 DIAGNOSIS — Z30011 Encounter for initial prescription of contraceptive pills: Secondary | ICD-10-CM

## 2024-06-02 MED ORDER — LEVONORGESTREL-ETHINYL ESTRAD 0.1-20 MG-MCG PO TABS: 1.0000 | ORAL_TABLET | Freq: Every day | ORAL | 0 refills | Status: DC

## 2024-08-18 ENCOUNTER — Telehealth: Payer: Self-pay | Admitting: Family Medicine

## 2024-08-18 NOTE — Telephone Encounter (Signed)
 Patient had to reschedule her appt from 9/22 due to provider out of office rescheduled to next opening in January 2026 with her provider. She concern refills on her birth control pills.

## 2024-08-19 ENCOUNTER — Other Ambulatory Visit: Payer: Self-pay

## 2024-08-19 DIAGNOSIS — Z30011 Encounter for initial prescription of contraceptive pills: Secondary | ICD-10-CM

## 2024-08-19 MED ORDER — LEVONORGESTREL-ETHINYL ESTRAD 0.1-20 MG-MCG PO TABS
1.0000 | ORAL_TABLET | Freq: Every day | ORAL | 1 refills | Status: AC
Start: 2024-08-19 — End: ?

## 2024-08-19 NOTE — Telephone Encounter (Signed)
 Sent!

## 2024-08-25 ENCOUNTER — Ambulatory Visit: Admitting: Family Medicine

## 2024-12-19 ENCOUNTER — Ambulatory Visit: Admitting: Family Medicine

## 2024-12-20 ENCOUNTER — Ambulatory Visit: Admitting: Family Medicine
